# Patient Record
Sex: Female | Born: 1990 | Race: Black or African American | Hispanic: No | Marital: Single | Smoking: Never smoker
Health system: Southern US, Community
[De-identification: ages and names within clinical notes are randomized; demographics above are authoritative.]

## PROBLEM LIST (undated history)

## (undated) DIAGNOSIS — F32A Depression, unspecified: Secondary | ICD-10-CM

## (undated) DIAGNOSIS — M549 Dorsalgia, unspecified: Secondary | ICD-10-CM

## (undated) DIAGNOSIS — M542 Cervicalgia: Secondary | ICD-10-CM

## (undated) DIAGNOSIS — I10 Essential (primary) hypertension: Secondary | ICD-10-CM

## (undated) DIAGNOSIS — R12 Heartburn: Secondary | ICD-10-CM

## (undated) DIAGNOSIS — G43909 Migraine, unspecified, not intractable, without status migrainosus: Secondary | ICD-10-CM

## (undated) DIAGNOSIS — O039 Complete or unspecified spontaneous abortion without complication: Secondary | ICD-10-CM

## (undated) DIAGNOSIS — G4700 Insomnia, unspecified: Secondary | ICD-10-CM

## (undated) DIAGNOSIS — F419 Anxiety disorder, unspecified: Secondary | ICD-10-CM

## (undated) DIAGNOSIS — F329 Major depressive disorder, single episode, unspecified: Secondary | ICD-10-CM

## (undated) DIAGNOSIS — D649 Anemia, unspecified: Secondary | ICD-10-CM

## (undated) HISTORY — DX: Cervicalgia: M54.2

## (undated) HISTORY — DX: Major depressive disorder, single episode, unspecified: F32.9

## (undated) HISTORY — DX: Insomnia, unspecified: G47.00

## (undated) HISTORY — DX: Migraine, unspecified, not intractable, without status migrainosus: G43.909

## (undated) HISTORY — DX: Anxiety disorder, unspecified: F41.9

## (undated) HISTORY — DX: Anemia, unspecified: D64.9

## (undated) HISTORY — DX: Complete or unspecified spontaneous abortion without complication: O03.9

## (undated) HISTORY — DX: Heartburn: R12

## (undated) HISTORY — PX: OTHER SURGICAL HISTORY: SHX169

## (undated) HISTORY — DX: Essential (primary) hypertension: I10

## (undated) HISTORY — DX: Dorsalgia, unspecified: M54.9

## (undated) HISTORY — DX: Depression, unspecified: F32.A

---

## 2015-10-22 DIAGNOSIS — N96 Recurrent pregnancy loss: Secondary | ICD-10-CM | POA: Insufficient documentation

## 2015-11-09 ENCOUNTER — Encounter (HOSPITAL_COMMUNITY): Payer: Self-pay

## 2015-11-13 ENCOUNTER — Encounter (HOSPITAL_COMMUNITY): Payer: Self-pay

## 2015-11-16 ENCOUNTER — Encounter (HOSPITAL_COMMUNITY): Payer: Self-pay

## 2015-11-20 ENCOUNTER — Encounter (HOSPITAL_COMMUNITY): Payer: Self-pay

## 2015-11-20 ENCOUNTER — Encounter (HOSPITAL_COMMUNITY): Payer: Self-pay | Admitting: Internal Medicine

## 2015-11-27 ENCOUNTER — Encounter (HOSPITAL_COMMUNITY): Payer: Self-pay

## 2015-11-27 ENCOUNTER — Ambulatory Visit (HOSPITAL_COMMUNITY)
Admission: RE | Admit: 2015-11-27 | Discharge: 2015-11-27 | Disposition: A | Discharge status: Home / Self Care | Payer: Medicaid Other | Source: Ambulatory Visit | Attending: Internal Medicine | Admitting: Internal Medicine

## 2015-11-27 DIAGNOSIS — E559 Vitamin D deficiency, unspecified: Secondary | ICD-10-CM | POA: Insufficient documentation

## 2015-11-27 DIAGNOSIS — I10 Essential (primary) hypertension: Secondary | ICD-10-CM | POA: Diagnosis not present

## 2015-11-27 DIAGNOSIS — E7800 Pure hypercholesterolemia, unspecified: Secondary | ICD-10-CM | POA: Insufficient documentation

## 2015-11-27 DIAGNOSIS — Z3169 Encounter for other general counseling and advice on procreation: Secondary | ICD-10-CM | POA: Insufficient documentation

## 2015-11-27 DIAGNOSIS — E119 Type 2 diabetes mellitus without complications: Secondary | ICD-10-CM | POA: Insufficient documentation

## 2015-11-27 DIAGNOSIS — N96 Recurrent pregnancy loss: Secondary | ICD-10-CM
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
## 2015-11-27 NOTE — ED Notes (Signed)
Patient vital signs Wt. 197.6 BP 123/84, 74

## 2015-12-06 DIAGNOSIS — R200 Anesthesia of skin: Secondary | ICD-10-CM | POA: Insufficient documentation

## 2015-12-06 DIAGNOSIS — R202 Paresthesia of skin: Secondary | ICD-10-CM | POA: Insufficient documentation

## 2015-12-06 DIAGNOSIS — F0781 Postconcussional syndrome: Secondary | ICD-10-CM | POA: Insufficient documentation

## 2016-08-22 ENCOUNTER — Ambulatory Visit: Payer: Medicaid Other | Admitting: Sports Medicine

## 2016-09-17 ENCOUNTER — Ambulatory Visit (INDEPENDENT_AMBULATORY_CARE_PROVIDER_SITE_OTHER): Payer: Medicaid Other

## 2016-09-17 ENCOUNTER — Ambulatory Visit (INDEPENDENT_AMBULATORY_CARE_PROVIDER_SITE_OTHER): Payer: Medicaid Other | Admitting: Sports Medicine

## 2016-09-17 ENCOUNTER — Encounter: Payer: Self-pay | Admitting: Sports Medicine

## 2016-09-17 DIAGNOSIS — M792 Neuralgia and neuritis, unspecified: Secondary | ICD-10-CM

## 2016-09-17 DIAGNOSIS — M79672 Pain in left foot: Secondary | ICD-10-CM

## 2016-09-17 DIAGNOSIS — M722 Plantar fascial fibromatosis: Secondary | ICD-10-CM

## 2016-09-17 DIAGNOSIS — M7752 Other enthesopathy of left foot: Secondary | ICD-10-CM

## 2016-09-17 DIAGNOSIS — M7750 Other enthesopathy of unspecified foot: Secondary | ICD-10-CM | POA: Diagnosis not present

## 2016-09-17 DIAGNOSIS — G9050 Complex regional pain syndrome I, unspecified: Secondary | ICD-10-CM

## 2016-09-17 MED ORDER — TRIAMCINOLONE ACETONIDE 40 MG/ML IJ SUSP: 20.0000 mg | Freq: Once | INTRAMUSCULAR | Status: AC

## 2016-09-17 MED ORDER — GABAPENTIN 300 MG PO CAPS: 300.0000 mg | ORAL_CAPSULE | Freq: Every day | ORAL | 3 refills | Status: DC

## 2016-09-17 MED ORDER — GABAPENTIN 300 MG PO CAPS: 300.0000 mg | ORAL_CAPSULE | Freq: Every day | ORAL | 3 refills | Status: AC
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
## 2016-10-15 ENCOUNTER — Encounter: Payer: Self-pay | Admitting: Sports Medicine

## 2016-10-15 ENCOUNTER — Ambulatory Visit (INDEPENDENT_AMBULATORY_CARE_PROVIDER_SITE_OTHER): Payer: Medicaid Other | Admitting: Sports Medicine

## 2016-10-15 DIAGNOSIS — M7752 Other enthesopathy of left foot: Secondary | ICD-10-CM

## 2016-10-15 DIAGNOSIS — M79672 Pain in left foot: Secondary | ICD-10-CM

## 2016-10-15 DIAGNOSIS — G9050 Complex regional pain syndrome I, unspecified: Secondary | ICD-10-CM

## 2016-10-15 DIAGNOSIS — M792 Neuralgia and neuritis, unspecified: Secondary | ICD-10-CM
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
## 2016-10-16 ENCOUNTER — Telehealth: Payer: Self-pay | Admitting: *Deleted

## 2016-10-16 DIAGNOSIS — M779 Enthesopathy, unspecified: Secondary | ICD-10-CM

## 2016-10-16 DIAGNOSIS — G9050 Complex regional pain syndrome I, unspecified: Secondary | ICD-10-CM

## 2016-10-16 DIAGNOSIS — M79672 Pain in left foot: Secondary | ICD-10-CM

## 2016-10-16 DIAGNOSIS — M792 Neuralgia and neuritis, unspecified: Secondary | ICD-10-CM
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
## 2016-10-29 LAB — C-REACTIVE PROTEIN: CRP: 3.8 mg/L (ref 0.0–4.9)

## 2016-10-29 LAB — CBC WITH DIFFERENTIAL/PLATELET
Basophils Absolute: 0 10*3/uL (ref 0.0–0.2)
Basos: 1 %
EOS (ABSOLUTE): 0.2 10*3/uL (ref 0.0–0.4)
Eos: 5 %
HEMOGLOBIN: 11.7 g/dL (ref 11.1–15.9)
Hematocrit: 35.4 % (ref 34.0–46.6)
Immature Grans (Abs): 0 10*3/uL (ref 0.0–0.1)
Immature Granulocytes: 0 %
LYMPHS ABS: 1.4 10*3/uL (ref 0.7–3.1)
Lymphs: 31 %
MCH: 26.2 pg — AB (ref 26.6–33.0)
MCHC: 33.1 g/dL (ref 31.5–35.7)
MCV: 79 fL (ref 79–97)
MONOS ABS: 0.2 10*3/uL (ref 0.1–0.9)
Monocytes: 4 %
NEUTROS ABS: 2.7 10*3/uL (ref 1.4–7.0)
Neutrophils: 59 %
Platelets: 319 10*3/uL (ref 150–379)
RBC: 4.46 x10E6/uL (ref 3.77–5.28)
RDW: 15.4 % (ref 12.3–15.4)
WBC: 4.4 10*3/uL (ref 3.4–10.8)

## 2016-10-29 LAB — BASIC METABOLIC PANEL
BUN / CREAT RATIO: 16 (ref 9–23)
BUN: 12 mg/dL (ref 6–20)
CO2: 22 mmol/L (ref 18–29)
CREATININE: 0.76 mg/dL (ref 0.57–1.00)
Calcium: 9.3 mg/dL (ref 8.7–10.2)
Chloride: 103 mmol/L (ref 96–106)
GFR calc Af Amer: 124 (ref >59)
GFR, EST NON AFRICAN AMERICAN: 108 (ref >59)
GLUCOSE: 92 mg/dL (ref 65–99)
POTASSIUM: 4.2 mmol/L (ref 3.5–5.2)
SODIUM: 141 mmol/L (ref 134–144)

## 2016-10-29 LAB — RHEUMATOID FACTOR

## 2016-10-29 LAB — SEDIMENTATION RATE: Sed Rate: 21 mm/hr (ref 0–32)

## 2016-10-29 LAB — HLA-B27 ANTIGEN: HLA B27: NEGATIVE

## 2016-10-29 LAB — ANA: Anti Nuclear Antibody(ANA): NEGATIVE

## 2016-10-31 NOTE — Telephone Encounter (Addendum)
-----   Message from [place] Stover, [place] sent at 10/29/2016  5:24 PM EST ----- Can you let patient know that her blood work is negative and to proceed with nerve studies and that I will see her back after she has gotten EMG/NCV tests done -Dr Kathie RhodesS. 10/31/2016-I informed pt so Dr. Wynema BirchStover's orders and gave [facility]'s [phone] to schedule her testing.

## 2017-03-16 ENCOUNTER — Encounter: Payer: Self-pay | Admitting: Neurology

## 2017-03-19 ENCOUNTER — Telehealth: Payer: Self-pay | Admitting: *Deleted

## 2017-03-19 DIAGNOSIS — M792 Neuralgia and neuritis, unspecified: Secondary | ICD-10-CM

## 2017-03-19 DIAGNOSIS — G9050 Complex regional pain syndrome I, unspecified: Secondary | ICD-10-CM

## 2017-03-19 NOTE — Telephone Encounter (Addendum)
Pt [place] [address] [place] closed and she needs to be referred to another group, [facility] at [phone]. [facility] [place] Attn:  [place].

## 2017-03-20 NOTE — Telephone Encounter (Signed)
Ok thank you 

## 2017-06-22 ENCOUNTER — Ambulatory Visit (INDEPENDENT_AMBULATORY_CARE_PROVIDER_SITE_OTHER): Payer: Self-pay | Admitting: Neurology

## 2017-06-22 ENCOUNTER — Encounter: Payer: Self-pay | Admitting: Neurology

## 2017-06-22 VITALS — BP 100/68 | HR 80 | Ht 71.0 in | Wt 204.5 lb

## 2017-06-22 DIAGNOSIS — R202 Paresthesia of skin: Secondary | ICD-10-CM

## 2017-06-22 DIAGNOSIS — M2550 Pain in unspecified joint: Secondary | ICD-10-CM

## 2017-06-22 DIAGNOSIS — M7918 Myalgia, other site: Secondary | ICD-10-CM
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
## 2017-06-22 NOTE — Patient Instructions (Addendum)
NCS/EMG of the right arm and leg  We will call you with the results of your testing

## 2017-07-07 ENCOUNTER — Encounter: Payer: Self-pay | Admitting: Neurology

## 2019-10-28 ENCOUNTER — Encounter: Payer: Self-pay | Admitting: Sports Medicine

## 2019-10-28 ENCOUNTER — Ambulatory Visit (INDEPENDENT_AMBULATORY_CARE_PROVIDER_SITE_OTHER): Payer: Medicaid Other

## 2019-10-28 ENCOUNTER — Other Ambulatory Visit: Payer: Self-pay | Admitting: Sports Medicine

## 2019-10-28 ENCOUNTER — Other Ambulatory Visit: Payer: Self-pay

## 2019-10-28 ENCOUNTER — Ambulatory Visit: Payer: Medicaid Other | Admitting: Sports Medicine

## 2019-10-28 DIAGNOSIS — M7752 Other enthesopathy of left foot: Secondary | ICD-10-CM

## 2019-10-28 DIAGNOSIS — G8929 Other chronic pain: Secondary | ICD-10-CM

## 2019-10-28 DIAGNOSIS — M25572 Pain in left ankle and joints of left foot: Secondary | ICD-10-CM | POA: Diagnosis not present

## 2019-10-28 DIAGNOSIS — M2140 Flat foot [pes planus] (acquired), unspecified foot: Secondary | ICD-10-CM

## 2019-10-28 DIAGNOSIS — M79672 Pain in left foot: Secondary | ICD-10-CM

## 2019-10-28 DIAGNOSIS — M76822 Posterior tibial tendinitis, left leg: Secondary | ICD-10-CM | POA: Diagnosis not present
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
## 2019-11-07 ENCOUNTER — Other Ambulatory Visit: Payer: Self-pay | Admitting: Sports Medicine

## 2019-11-07 DIAGNOSIS — M7752 Other enthesopathy of left foot: Secondary | ICD-10-CM

## 2020-02-08 ENCOUNTER — Ambulatory Visit: Payer: Medicaid Other | Admitting: Sports Medicine

## 2020-02-08 ENCOUNTER — Other Ambulatory Visit: Payer: Self-pay | Admitting: Sports Medicine

## 2020-02-08 DIAGNOSIS — M25572 Pain in left ankle and joints of left foot: Secondary | ICD-10-CM

## 2020-02-09 ENCOUNTER — Ambulatory Visit (INDEPENDENT_AMBULATORY_CARE_PROVIDER_SITE_OTHER): Payer: Medicaid Other | Admitting: Sports Medicine

## 2020-02-09 ENCOUNTER — Encounter: Payer: Self-pay | Admitting: Sports Medicine

## 2020-02-09 ENCOUNTER — Other Ambulatory Visit: Payer: Self-pay

## 2020-02-09 DIAGNOSIS — M792 Neuralgia and neuritis, unspecified: Secondary | ICD-10-CM

## 2020-02-09 DIAGNOSIS — M779 Enthesopathy, unspecified: Secondary | ICD-10-CM

## 2020-02-09 DIAGNOSIS — M25572 Pain in left ankle and joints of left foot: Secondary | ICD-10-CM

## 2020-02-09 DIAGNOSIS — M25372 Other instability, left ankle: Secondary | ICD-10-CM

## 2020-02-09 DIAGNOSIS — M7752 Other enthesopathy of left foot: Secondary | ICD-10-CM | POA: Diagnosis not present

## 2020-02-09 MED ORDER — PREDNISONE 10 MG (21) PO TBPK: ORAL_TABLET | ORAL | 0 refills | Status: AC

## 2020-02-09 NOTE — Progress Notes (Signed)
Subjective: Kristina Yates is a 29 y.o. female patient who presents to office for evaluation of left ankle pain. Patient complains of continued pain in the ankle after twisting it when walking on Friday. Patient has tried ibuprofen with no relief in symptoms.  Reports that she went to the hospital and they took x-rays nothing was fracture and they put her in an Ace wrap with a ankle stabilizer but still hurts specially even at night with the way the covers on her foot and ankle.  Reports random sharp shooting pain across the entire foot and ankle pain 7 out of 10.  Patient reports that she has been walking on toes due to pain in her foot and ankle and walking on her toes makes the pain more tolerable.  No other issues noted.  Patient Active Problem List   Diagnosis Date Noted  . Numbness and tingling 12/06/2015  . Post concussion syndrome 12/06/2015  . History of recurrent miscarriages, not currently pregnant 10/22/2015    Current Outpatient Medications on File Prior to Visit  Medication Sig Dispense Refill  . cyclobenzaprine (FEXMID) 7.5 MG tablet Take 7.5 mg by mouth 3 (three) times daily as needed for muscle spasms.    Marland Kitchen gabapentin (NEURONTIN) 300 MG capsule Take 1 capsule (300 mg total) by mouth at bedtime. 90 capsule 3   Current Facility-Administered Medications on File Prior to Visit  Medication Dose Route Frequency Provider Last Rate Last Admin  . triamcinolone acetonide (KENALOG-40) injection 20 mg  20 mg Other Once Asencion Islam, DPM        Allergies  Allergen Reactions  . Tylenol [Acetaminophen] Hives and Rash    Objective:  General: Alert and oriented x3 in no acute distress  Dermatology: No open lesions bilateral lower extremities, no webspace macerations, no ecchymosis bilateral, all nails x 10 are well manicured.  Vascular: Dorsalis Pedis and Posterior Tibial pedal pulses palpable, Capillary Fill Time 3 seconds,(+) pedal hair growth bilateral, no edema bilateral lower  extremities, Temperature gradient within normal limits.  Neurology: Gross sensation intact via light touch bilateral, subjective sharp shooting pain to the left foot and ankle.   Musculoskeletal: Mild tenderness with palpation at lateral greater than medial ankle worse with plantarflexion and inversion of the left foot and ankle.  Mild ankle instability noted to the left. Strength within normal limits in all groups bilateral.   Gait: Antalgic gait   Assessment and Plan: Problem List Items Addressed This Visit    None    Visit Diagnoses    Left ankle pain, unspecified chronicity    -  Primary   Capsulitis of ankle, left       Relevant Medications   predniSONE (STERAPRED UNI-PAK 21 TAB) 10 MG (21) TBPK tablet   Neuritis       Ankle instability, left       Tendonitis          -Complete examination performed -Previous x-rays negative -Discussed treatement options for history of chronic ankle pain with recurrent injury/sprain -Rx prednisone -Applied Unna boot for patient to keep intact for at least 5 days and dispensed a use postoperative shoe for patient to wear during this timeframe advised patient that when she removes the [employer] boot if it feels better to continue with good supportive shoes and ankle support however if pain is unrelieved to call office after 1 week for [place] to proceed with ordering an MRI -Patient to return to office as needed or sooner if condition worsens.  Lyndon Chapel  Cannon Kettle, DPM

## 2020-02-17 ENCOUNTER — Telehealth: Payer: Self-pay | Admitting: Urology

## 2020-02-17 DIAGNOSIS — M7752 Other enthesopathy of left foot: Secondary | ICD-10-CM

## 2020-02-17 DIAGNOSIS — M792 Neuralgia and neuritis, unspecified: Secondary | ICD-10-CM

## 2020-02-17 DIAGNOSIS — M25572 Pain in left ankle and joints of left foot: Secondary | ICD-10-CM

## 2020-02-17 DIAGNOSIS — M25372 Other instability, left ankle: Secondary | ICD-10-CM

## 2020-02-17 DIAGNOSIS — M779 Enthesopathy, unspecified: Secondary | ICD-10-CM

## 2020-02-17 NOTE — Telephone Encounter (Signed)
Order MRI ankle please refer to my last clinical note Thanks Dr. Kathie Rhodes

## 2020-02-17 NOTE — Telephone Encounter (Signed)
Pt took soft cast off after wearing it for a week and is still not able to put full weight on ankle would like to have an MRI done. Please Advise. Thanks

## 2020-02-20 NOTE — Telephone Encounter (Signed)
[facility] - Medicaid requires clinicals for review to pre-cert [id number] left ankle without contrast Case:  [id number]. Faxed clinicals to [facility].

## 2020-02-24 NOTE — Telephone Encounter (Signed)
Changed to [facility] with Derrek Gu.

## 2020-02-24 NOTE — Telephone Encounter (Signed)
[facility] - Candace scheduled pt for MRI [id number] left ankle without contrast on 02/28/2020 arrive 4:15pm for 4:30pm testing. Faxed orders with approval to [facility].

## 2020-02-24 NOTE — Telephone Encounter (Signed)
[facility] - Melissa states the MRI is denied and ordered for the hospital. I gave Melissa the PA from Dr. Wynema Birch PEER to PEER [id number].

## 2020-03-06 ENCOUNTER — Encounter: Payer: Self-pay | Admitting: Sports Medicine

## 2020-03-08 DIAGNOSIS — Z419 Encounter for procedure for purposes other than remedying health state, unspecified: Secondary | ICD-10-CM | POA: Diagnosis not present

## 2020-04-08 DIAGNOSIS — Z419 Encounter for procedure for purposes other than remedying health state, unspecified: Secondary | ICD-10-CM | POA: Diagnosis not present

## 2020-04-27 DIAGNOSIS — R109 Unspecified abdominal pain: Secondary | ICD-10-CM | POA: Diagnosis not present

## 2020-04-27 DIAGNOSIS — R110 Nausea: Secondary | ICD-10-CM | POA: Diagnosis not present

## 2020-04-27 DIAGNOSIS — N946 Dysmenorrhea, unspecified: Secondary | ICD-10-CM | POA: Diagnosis not present

## 2020-04-27 DIAGNOSIS — R1030 Lower abdominal pain, unspecified: Secondary | ICD-10-CM | POA: Diagnosis not present

## 2020-04-27 DIAGNOSIS — R197 Diarrhea, unspecified: Secondary | ICD-10-CM | POA: Diagnosis not present

## 2020-05-09 DIAGNOSIS — Z419 Encounter for procedure for purposes other than remedying health state, unspecified: Secondary | ICD-10-CM | POA: Diagnosis not present

## 2020-05-21 DIAGNOSIS — N898 Other specified noninflammatory disorders of vagina: Secondary | ICD-10-CM | POA: Diagnosis not present

## 2020-05-28 DIAGNOSIS — R1031 Right lower quadrant pain: Secondary | ICD-10-CM | POA: Diagnosis not present

## 2020-05-29 DIAGNOSIS — R109 Unspecified abdominal pain: Secondary | ICD-10-CM | POA: Diagnosis not present

## 2020-05-29 DIAGNOSIS — R10819 Abdominal tenderness, unspecified site: Secondary | ICD-10-CM | POA: Diagnosis not present

## 2020-06-06 DIAGNOSIS — Z113 Encounter for screening for infections with a predominantly sexual mode of transmission: Secondary | ICD-10-CM | POA: Diagnosis not present

## 2020-06-06 DIAGNOSIS — Z1329 Encounter for screening for other suspected endocrine disorder: Secondary | ICD-10-CM | POA: Diagnosis not present

## 2020-06-06 DIAGNOSIS — N979 Female infertility, unspecified: Secondary | ICD-10-CM | POA: Diagnosis not present

## 2020-06-06 DIAGNOSIS — Z1159 Encounter for screening for other viral diseases: Secondary | ICD-10-CM | POA: Diagnosis not present

## 2020-06-06 DIAGNOSIS — N96 Recurrent pregnancy loss: Secondary | ICD-10-CM | POA: Diagnosis not present

## 2020-06-06 DIAGNOSIS — Z0000 Encounter for general adult medical examination without abnormal findings: Secondary | ICD-10-CM | POA: Diagnosis not present

## 2020-06-06 DIAGNOSIS — Z01411 Encounter for gynecological examination (general) (routine) with abnormal findings: Secondary | ICD-10-CM | POA: Diagnosis not present

## 2020-06-06 DIAGNOSIS — Z114 Encounter for screening for human immunodeficiency virus [HIV]: Secondary | ICD-10-CM | POA: Diagnosis not present

## 2020-06-06 DIAGNOSIS — R7989 Other specified abnormal findings of blood chemistry: Secondary | ICD-10-CM | POA: Diagnosis not present

## 2020-06-08 DIAGNOSIS — Z419 Encounter for procedure for purposes other than remedying health state, unspecified: Secondary | ICD-10-CM | POA: Diagnosis not present

## 2020-07-05 DIAGNOSIS — N979 Female infertility, unspecified: Secondary | ICD-10-CM | POA: Diagnosis not present

## 2020-07-05 DIAGNOSIS — N96 Recurrent pregnancy loss: Secondary | ICD-10-CM | POA: Diagnosis not present

## 2020-07-09 DIAGNOSIS — Z419 Encounter for procedure for purposes other than remedying health state, unspecified: Secondary | ICD-10-CM | POA: Diagnosis not present

## 2020-07-18 DIAGNOSIS — M25561 Pain in right knee: Secondary | ICD-10-CM | POA: Diagnosis not present

## 2020-07-18 DIAGNOSIS — M5459 Other low back pain: Secondary | ICD-10-CM | POA: Diagnosis not present

## 2020-08-05 DIAGNOSIS — R109 Unspecified abdominal pain: Secondary | ICD-10-CM | POA: Diagnosis not present

## 2020-08-08 DIAGNOSIS — Z419 Encounter for procedure for purposes other than remedying health state, unspecified: Secondary | ICD-10-CM | POA: Diagnosis not present

## 2020-09-08 DIAGNOSIS — Z419 Encounter for procedure for purposes other than remedying health state, unspecified: Secondary | ICD-10-CM | POA: Diagnosis not present

## 2020-09-27 DIAGNOSIS — J069 Acute upper respiratory infection, unspecified: Secondary | ICD-10-CM | POA: Diagnosis not present

## 2020-09-27 DIAGNOSIS — Z20822 Contact with and (suspected) exposure to covid-19: Secondary | ICD-10-CM | POA: Diagnosis not present

## 2020-09-27 DIAGNOSIS — U071 COVID-19: Secondary | ICD-10-CM | POA: Diagnosis not present

## 2020-10-09 DIAGNOSIS — Z419 Encounter for procedure for purposes other than remedying health state, unspecified: Secondary | ICD-10-CM | POA: Diagnosis not present

## 2020-10-17 DIAGNOSIS — M62830 Muscle spasm of back: Secondary | ICD-10-CM | POA: Diagnosis not present

## 2020-11-06 DIAGNOSIS — Z419 Encounter for procedure for purposes other than remedying health state, unspecified: Secondary | ICD-10-CM | POA: Diagnosis not present

## 2020-12-07 DIAGNOSIS — Z419 Encounter for procedure for purposes other than remedying health state, unspecified: Secondary | ICD-10-CM | POA: Diagnosis not present

## 2020-12-25 ENCOUNTER — Encounter (HOSPITAL_COMMUNITY): Payer: Self-pay | Admitting: Emergency Medicine

## 2020-12-25 ENCOUNTER — Emergency Department (HOSPITAL_COMMUNITY)
Admission: EM | Admit: 2020-12-25 | Discharge: 2020-12-25 | Discharge status: Against Medical Advice | Payer: Medicaid Other | Attending: Emergency Medicine | Admitting: Emergency Medicine

## 2020-12-25 DIAGNOSIS — S161XXA Strain of muscle, fascia and tendon at neck level, initial encounter: Secondary | ICD-10-CM | POA: Diagnosis not present

## 2020-12-25 DIAGNOSIS — N96 Recurrent pregnancy loss: Secondary | ICD-10-CM | POA: Diagnosis not present

## 2020-12-25 DIAGNOSIS — M542 Cervicalgia: Secondary | ICD-10-CM | POA: Diagnosis not present

## 2020-12-25 DIAGNOSIS — F5089 Other specified eating disorder: Secondary | ICD-10-CM | POA: Diagnosis not present

## 2020-12-25 DIAGNOSIS — N979 Female infertility, unspecified: Secondary | ICD-10-CM | POA: Diagnosis not present

## 2020-12-25 DIAGNOSIS — I10 Essential (primary) hypertension: Secondary | ICD-10-CM | POA: Insufficient documentation

## 2020-12-25 DIAGNOSIS — D649 Anemia, unspecified: Secondary | ICD-10-CM | POA: Diagnosis not present

## 2020-12-25 DIAGNOSIS — X500XXA Overexertion from strenuous movement or load, initial encounter: Secondary | ICD-10-CM | POA: Diagnosis not present

## 2020-12-25 MED ORDER — PREDNISONE 20 MG PO TABS
60.0000 mg | ORAL_TABLET | Freq: Once | ORAL | Status: DC
  Filled 2020-12-25: qty 3

## 2020-12-25 NOTE — ED Notes (Signed)
This RN went to give meds, pt not currently in room.

## 2020-12-25 NOTE — ED Provider Notes (Signed)
[facility] EMERGENCY DEPARTMENT Provider Note   CSN: [id number] Arrival date & time: 12/25/20  8546     History No chief complaint on file.   Kristina Yates is a 30 y.o. female.  HPI She presents for evaluation of right-sided neck pain, radiating to her right arm, onset 2 days ago spontaneously.  Yesterday she went to work and had to alter her activities because of pain.  She completed physical therapy about 2 weeks ago, as treatment for a neck injury that occurred several weeks ago.  At the completion of physical therapy she was referred back to the clinic that had referred her for PT.  She tried to see the clinic, an urgent care, but they referred her back to PT.  She does not have any management by a specialist.  She states that she is trying to receive care, through the [employer] system.  She did not go to work today because of the pain which she is having.  She states that she has previously been treated with prednisone for pain similar to this.  As an after thought she told me that she was having "headaches."      Past Medical History:  Diagnosis Date  . Anemia   . Anxiety   . Back pain   . Depression   . Heartburn   . Hypertension   . Insomnia   . Migraine   . Miscarriage   . Neck pain     Patient Active Problem List   Diagnosis Date Noted  . Numbness and tingling 12/06/2015  . Post concussion syndrome 12/06/2015  . History of recurrent miscarriages, not currently pregnant 10/22/2015    Past Surgical History:  Procedure Laterality Date  . ovarian cyst resection       OB History    Gravida  8   Para      Term      Preterm      AB  7   Living  1     SAB  7   IAB      Ectopic      Multiple      Live Births  1           Family History  Problem Relation Age of Onset  . Asthma Mother   . Hypertension Mother   . Hypothyroidism Sister   . Hypertension Maternal Grandmother     Social History   Tobacco  Use  . Smoking status: Never Smoker  . Smokeless tobacco: Never Used  Vaping Use  . Vaping Use: Never used  Substance Use Topics  . Alcohol use: No  . Drug use: No    Home Medications Prior to Admission medications   Medication Sig Start Date End Date Taking? Authorizing Provider  cyclobenzaprine (FEXMID) 7.5 MG tablet Take 7.5 mg by mouth 3 (three) times daily as needed for muscle spasms.    [provider]  gabapentin (NEURONTIN) 300 MG capsule Take 1 capsule (300 mg total) by mouth at bedtime. 09/17/16   Asencion Islam, DPM  predniSONE (STERAPRED UNI-PAK 21 TAB) 10 MG (21) TBPK tablet Take as directed 02/09/20   Asencion Islam, DPM    Allergies    Tylenol [acetaminophen]  Review of Systems   Review of Systems  All other systems reviewed and are negative.   Physical Exam Updated Vital Signs BP 118/74   Pulse 72   Temp 97.8 F (36.6 C) (Oral)   Resp 19  SpO2 100%   Physical Exam Vitals and nursing note reviewed.  Constitutional:      General: She is not in acute distress.    Appearance: She is well-developed. She is not ill-appearing or diaphoretic.  HENT:     Head: Normocephalic and atraumatic.     Right Ear: External ear normal.     Left Ear: External ear normal.  Eyes:     Conjunctiva/sclera: Conjunctivae normal.     Pupils: Pupils are equal, round, and reactive to light.  Neck:     Trachea: Phonation normal.  Cardiovascular:     Rate and Rhythm: Normal rate.  Pulmonary:     Effort: Pulmonary effort is normal.  Abdominal:     General: There is no distension.  Musculoskeletal:        General: Tenderness (Right paraspinal cervical and right trapezius; mild) present. No swelling. Normal range of motion.     Cervical back: Normal range of motion and neck supple.  Skin:    General: Skin is warm and dry.  Neurological:     Mental Status: She is alert and oriented to person, place, and time.     Cranial Nerves: No cranial nerve deficit.      Sensory: No sensory deficit.     Motor: No abnormal muscle tone.     Coordination: Coordination normal.  Psychiatric:        Mood and Affect: Mood normal.        Behavior: Behavior normal.        Thought Content: Thought content normal.        Judgment: Judgment normal.     ED Results / Procedures / Treatments   Labs (all labs ordered are listed, but only abnormal results are displayed) Labs Reviewed - No data to display  EKG None  Radiology No results found.  Procedures Procedures   Medications Ordered in ED Medications  predniSONE (DELTASONE) tablet 60 mg (has no administration in time range)    ED Course  I have reviewed the triage vital signs and the nursing notes.  Pertinent labs & imaging results that were available during my care of the patient were reviewed by me and considered in my medical decision making (see chart for details).  Clinical Course as of 12/25/20 1021  Tue Dec 25, 2020  1020 She is not currently in the room. [EW]  1020 Prednisone ordered at 830 was apparently not given. [EW]    Clinical Course User Index [EW] Mancel Bale, MD   MDM Rules/Calculators/A&P                           Patient Vitals for the past 24 hrs:  BP Temp Temp src Pulse Resp SpO2  12/25/20 0900 118/74 -- -- 72 19 100 %  12/25/20 0812 134/68 97.8 F (36.6 C) Oral 82 18 100 %    10:21 AM Reevaluation with update and discussion. After initial assessment and treatment, an updated evaluation reveals she has a low. Mancel Bale   Medical Decision Making:  This patient is presenting for evaluation of neck pain ongoing/recurrent, which does require a range of treatment options, and is a complaint that involves a moderate risk of morbidity and mortality. The differential diagnoses include muscle strain, radiculopathy, progression of chronic neck pain. I decided to review old records, and in summary Young female presenting with neck pain that started spontaneously 2 days  ago, similar to prior pain which required  physical therapy.  I do not recall additional historical information from anyone.    Critical Interventions-clinical evaluation, monitoring  After These Interventions, the Patient was reevaluated and was found to have left prior to reevaluation.  CRITICAL CARE-no Performed by: Mancel Bale   Nursing Notes Reviewed/ Care Coordinated Applicable Imaging Reviewed Interpretation of Laboratory Data incorporated into ED treatment  The patient left AGAINST MEDICAL ADVICE.   Final Clinical Impression(s) / ED Diagnoses Final diagnoses:  Neck pain    Rx / DC Orders ED Discharge Orders    None       Mancel Bale, MD 12/25/20 2031

## 2020-12-25 NOTE — ED Notes (Signed)
Pt never returned to room & was discharged AMA.

## 2020-12-25 NOTE — ED Triage Notes (Signed)
Pt here with c/o neck pain mostly on the right side , pt hurt her neck  back in feb at work was seen at [employer] , pt just finished up PT a few weeks ago,

## 2021-01-06 DIAGNOSIS — Z419 Encounter for procedure for purposes other than remedying health state, unspecified: Secondary | ICD-10-CM | POA: Diagnosis not present

## 2021-02-06 DIAGNOSIS — Z419 Encounter for procedure for purposes other than remedying health state, unspecified: Secondary | ICD-10-CM | POA: Diagnosis not present

## 2021-03-08 DIAGNOSIS — Z419 Encounter for procedure for purposes other than remedying health state, unspecified: Secondary | ICD-10-CM | POA: Diagnosis not present

## 2021-03-11 DIAGNOSIS — R300 Dysuria: Secondary | ICD-10-CM | POA: Diagnosis not present

## 2021-03-11 DIAGNOSIS — N898 Other specified noninflammatory disorders of vagina: Secondary | ICD-10-CM | POA: Diagnosis not present

## 2021-03-15 ENCOUNTER — Emergency Department (HOSPITAL_BASED_OUTPATIENT_CLINIC_OR_DEPARTMENT_OTHER)
Admission: EM | Admit: 2021-03-15 | Discharge: 2021-03-15 | Disposition: A | Discharge status: Home / Self Care | Payer: No Typology Code available for payment source | Attending: Emergency Medicine | Admitting: Emergency Medicine

## 2021-03-15 ENCOUNTER — Encounter (HOSPITAL_BASED_OUTPATIENT_CLINIC_OR_DEPARTMENT_OTHER): Payer: Self-pay | Admitting: *Deleted

## 2021-03-15 ENCOUNTER — Emergency Department (HOSPITAL_BASED_OUTPATIENT_CLINIC_OR_DEPARTMENT_OTHER): Payer: No Typology Code available for payment source

## 2021-03-15 ENCOUNTER — Other Ambulatory Visit: Payer: Self-pay

## 2021-03-15 DIAGNOSIS — W208XXA Other cause of strike by thrown, projected or falling object, initial encounter: Secondary | ICD-10-CM | POA: Diagnosis not present

## 2021-03-15 DIAGNOSIS — Y990 Civilian activity done for income or pay: Secondary | ICD-10-CM | POA: Insufficient documentation

## 2021-03-15 DIAGNOSIS — I10 Essential (primary) hypertension: Secondary | ICD-10-CM | POA: Insufficient documentation

## 2021-03-15 DIAGNOSIS — S335XXA Sprain of ligaments of lumbar spine, initial encounter: Secondary | ICD-10-CM

## 2021-03-15 DIAGNOSIS — M546 Pain in thoracic spine: Secondary | ICD-10-CM | POA: Insufficient documentation

## 2021-03-15 DIAGNOSIS — S3992XA Unspecified injury of lower back, initial encounter: Secondary | ICD-10-CM | POA: Diagnosis present

## 2021-03-15 LAB — PREGNANCY, URINE: Preg Test, Ur: NEGATIVE

## 2021-03-15 MED ORDER — NAPROXEN 500 MG PO TABS: 500.0000 mg | ORAL_TABLET | Freq: Two times a day (BID) | ORAL | 0 refills | Status: AC

## 2021-03-15 MED ORDER — METHOCARBAMOL 500 MG PO TABS: 500.0000 mg | ORAL_TABLET | Freq: Two times a day (BID) | ORAL | 0 refills | Status: AC

## 2021-03-15 MED ORDER — KETOROLAC TROMETHAMINE 30 MG/ML IJ SOLN
30.0000 mg | Freq: Once | INTRAMUSCULAR | Status: AC
  Administered 2021-03-15: 30 mg via INTRAMUSCULAR
  Filled 2021-03-15: qty 1

## 2021-03-15 MED ORDER — METHOCARBAMOL 500 MG PO TABS
500.0000 mg | ORAL_TABLET | Freq: Once | ORAL | Status: AC
  Administered 2021-03-15: 500 mg via ORAL
  Filled 2021-03-15: qty 1

## 2021-03-15 NOTE — ED Provider Notes (Signed)
[facility] EMERGENCY DEPARTMENT Provider Note   CSN: [id number] Arrival date & time: 03/15/21  2043     History Chief Complaint  Patient presents with   Back Injury    Kristina Yates is a 30 y.o. female with past medical history of anxiety, hypertension that presents to the emerged from today for back injury.  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  Patient states that she went home and ice the area, used Flexeril and NSAIDs that day without relief.  Patient states that she went back to work the next day and is still having the pain. Pain radiates into her right side of her thoracic region..Pain worse when she twists to the right.   Has not used any medication since the first day.  Normal gait.   Denies any numbness or tingling.  Denies any fevers, chills, nausea, vomiting, dysuria, chest pain or shortness of breath.  Denies any abdominal pain.  Patient states that she was in her normal health prior to this.  Patient also states that she does not do any IV drug use, no saddle paresthesias, no bowel or bladder incontinence, previous back surgery..  Patient states that she has been able to work, however is upset since the pain is still there.    HPI     Past Medical History:  Diagnosis Date   Anemia    Anxiety    Back pain    Depression    Heartburn    Hypertension    Insomnia    Migraine    Miscarriage    Neck pain     Patient Active Problem List   Diagnosis Date Noted   Numbness and tingling 12/06/2015   Post concussion syndrome 12/06/2015   History of recurrent miscarriages, not currently pregnant 10/22/2015    Past Surgical History:  Procedure Laterality Date   ovarian cyst resection       OB History     Gravida  8   Para      Term      Preterm      AB  7   Living   1      SAB  7   IAB      Ectopic      Multiple      Live Births  1           Family History  Problem Relation Age of Onset   Asthma Mother    Hypertension Mother    Hypothyroidism Sister    Hypertension Maternal Grandmother     Social History   Tobacco Use   Smoking status: Never   Smokeless tobacco: Never  Vaping Use   Vaping Use: Never used  Substance Use Topics   Alcohol use: No   Drug use: No    Home Medications Prior to Admission medications   Medication Sig Start Date End Date Taking? Authorizing Provider  cyclobenzaprine (FEXMID) 7.5 MG tablet Take 7.5 mg by mouth 3 (three) times daily as needed for muscle spasms.   Yes [provider]  gabapentin (NEURONTIN) 300 MG capsule Take 1 capsule (300 mg total) by mouth at bedtime. 09/17/16  Yes Stover, Titorya, DPM  methocarbamol (ROBAXIN) 500 MG tablet Take 1 tablet (500 mg total) by mouth 2 (  two) times daily. 03/15/21  Yes Kaspar Albornoz, PA-C  naproxen (NAPROSYN) 500 MG tablet Take 1 tablet (500 mg total) by mouth 2 (two) times daily. 03/15/21  Yes Farrel Gordon, PA-C  predniSONE (STERAPRED UNI-PAK 21 TAB) 10 MG (21) TBPK tablet Take as directed 02/09/20   Asencion Islam, DPM    Allergies    Tylenol [acetaminophen]  Review of Systems   Review of Systems  Constitutional:  Negative for diaphoresis, fatigue and fever.  Eyes:  Negative for visual disturbance.  Respiratory:  Negative for shortness of breath.   Cardiovascular:  Negative for chest pain.  Gastrointestinal:  Negative for nausea and vomiting.  Musculoskeletal:  Positive for back pain. Negative for myalgias.  Skin:  Negative for color change, pallor, rash and wound.  Neurological:  Negative for syncope, weakness, light-headedness, numbness and headaches.  Psychiatric/Behavioral:  Negative for behavioral problems and confusion.    Physical Exam Updated Vital Signs BP 126/75 (BP Location: Right Arm)   Pulse 70   Temp 98.9 F (37.2 C)  (Oral)   Resp 18   Ht 5\' 11"  (1.803 m)   Wt 87.5 kg   LMP 03/12/2021   SpO2 100%   BMI 26.92 kg/m   Physical Exam Constitutional:      General: She is not in acute distress.    Appearance: Normal appearance. She is not ill-appearing, toxic-appearing or diaphoretic.  Cardiovascular:     Rate and Rhythm: Normal rate and regular rhythm.     Pulses: Normal pulses.  Pulmonary:     Effort: Pulmonary effort is normal.     Breath sounds: Normal breath sounds.  Musculoskeletal:        General: Normal range of motion.       Back:     Comments: Patient with tenderness in the mid back, between thoracic and lumbar regions.  TTP to right side of this as well, see diagram.  Do not appreciate any muscle spasms, no objective numbness.  Able to range back normally.  Pain does cross midline, however more on the right side.  No area of fluctuance or erythema or warmth.  Skin:    General: Skin is warm and dry.     Capillary Refill: Capillary refill takes less than 2 seconds.  Neurological:     General: No focal deficit present.     Mental Status: She is alert and oriented to person, place, and time.  Psychiatric:        Mood and Affect: Mood normal.        Behavior: Behavior normal.        Thought Content: Thought content normal.    ED Results / Procedures / Treatments   Labs (all labs ordered are listed, but only abnormal results are displayed) Labs Reviewed  PREGNANCY, URINE    EKG None  Radiology DG Thoracic Spine 2 View  Result Date: 03/15/2021 CLINICAL DATA:  Back pain following heavy lifting, initial encounter EXAM: THORACIC SPINE 2 VIEWS COMPARISON:  None. FINDINGS: There is no evidence of thoracic spine fracture. Alignment is normal. No other significant bone abnormalities are identified. IMPRESSION: No acute abnormality noted. Electronically Signed   By: 05/16/2021 M.D.   On: 03/15/2021 23:26   DG Lumbar Spine Complete  Result Date: 03/15/2021 CLINICAL DATA:  Low back pain  following heavy lifting, initial encounter EXAM: LUMBAR SPINE - COMPLETE 4+ VIEW COMPARISON:  None. FINDINGS: There is no evidence of lumbar spine fracture. Alignment is normal. Intervertebral disc spaces are maintained.  IMPRESSION: No acute abnormality noted. Electronically Signed   By: Alcide Clever M.D.   On: 03/15/2021 23:26    Procedures Procedures   Medications Ordered in ED Medications  methocarbamol (ROBAXIN) tablet 500 mg (500 mg Oral Given 03/15/21 2236)  ketorolac (TORADOL) 30 MG/ML injection 30 mg (30 mg Intramuscular Given 03/15/21 2236)    ED Course  I have reviewed the triage vital signs and the nursing notes.  Pertinent labs & imaging results that were available during my care of the patient were reviewed by me and considered in my medical decision making (see chart for details).    MDM Rules/Calculators/A&P                          Patient presents to the emergency department today for back pain after work injury.  Patient is nontoxic-appearing, vitals are stable, does not appear as if his patient in pain, laying with no acute distress.  No pain upon ambulation.  Patient has a normal neuro exam, back pain without any red flag symptoms.  Negative leg raise.  No concerns for abscess or cord injury. Plain films are negative.  Patient most likely has a lumbar sprain, discussed this with patient.  Given Toradol and Robaxin here.  Symptomatic treatment discussed.  Will follow up with sports medicine.  Exercise provided, symptomatic treatment discussed, strict return precautions given.  Doubt need for further emergent work up at this time. I explained the diagnosis and have given explicit precautions to return to the ER including for any other new or worsening symptoms. The patient understands and accepts the medical plan as it's been dictated and I have answered their questions. Discharge instructions concerning home care and prescriptions have been given. The patient is STABLE and is  discharged to home in good condition.  Final Clinical Impression(s) / ED Diagnoses Final diagnoses:  Lumbar sprain, initial encounter    Rx / DC Orders ED Discharge Orders          Ordered    methocarbamol (ROBAXIN) 500 MG tablet  2 times daily        03/15/21 2342    naproxen (NAPROSYN) 500 MG tablet  2 times daily        03/15/21 2342             Farrel Gordon, PA-C 03/15/21 2355    Little, Ambrose [place], MD 03/16/21 [phone]

## 2021-03-15 NOTE — Discharge Instructions (Addendum)
You have a lumbar sprain, please use the attached instructions.  I want you to take the naproxen as we discussed, do not take other NSAIDs when taking this.  I also want you to take the Robaxin, do not take other muscle relaxants when taking this such as Flexeril.  Do not operate heavy machinery, drink alcohol when using this medication.  Please speak to pharmacist today about any new medications prescribed in regards to side effects or interactions with other medications.  If you have any new or worsening concerning symptom please come into the emergency department.  Please take the naproxen and the Robaxin for the next 5 days as prescribed on the bottle.  Back Pain:  Your back pain should be treated with medicines such as ibuprofen or aleve and this back pain should get better over the next 2 weeks.  However if you develop severe or worsening pain, low back pain with fever, numbness, weakness or inability to walk or urinate, you should return to the ER immediately.  Please follow up with your doctor this week for a recheck if still having symptoms.  Self - care:  The application of heat can help soothe the pain.  Maintaining your daily activities, including walking, is encourged, as it will help you get better faster than just staying in bed.  Medications are also useful to help with pain control.  A commonly prescribed medications includes acetaminophen.  This medication is generally safe, though you should not take more than 8 of the extra strength (500mg ) pills a day.  Non steroidal anti inflammatory medications including Ibuprofen and naproxen;  These medications help both pain and swelling and are very useful in treating back pain.  They should be taken with food, as they can cause stomach upset, and more seriously, stomach bleeding.    Muscle relaxants:  These medications can help with muscle tightness that is a cause of lower back pain.  Most of these medications can cause drowsiness, and it is  not safe to drive or use dangerous machinery while taking them.  You will need to follow up with  Your primary healthcare provider in 1-2 weeks for reassessment.  Be aware that if you develop new symptoms, such as a fever, leg weakness, difficulty with or loss of control of your urine or bowels, abdominal pain, or more severe pain, you will need to seek medical attention and  / or return to the Emergency department.  If you do not have a doctor see the list below.

## 2021-03-15 NOTE — ED Triage Notes (Signed)
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 She is ambulatory.

## 2021-04-05 DIAGNOSIS — F319 Bipolar disorder, unspecified: Secondary | ICD-10-CM | POA: Diagnosis not present

## 2021-04-05 DIAGNOSIS — F411 Generalized anxiety disorder: Secondary | ICD-10-CM | POA: Diagnosis not present

## 2021-04-08 DIAGNOSIS — Z419 Encounter for procedure for purposes other than remedying health state, unspecified: Secondary | ICD-10-CM | POA: Diagnosis not present

## 2021-04-17 DIAGNOSIS — N760 Acute vaginitis: Secondary | ICD-10-CM | POA: Diagnosis not present

## 2021-04-17 DIAGNOSIS — A5901 Trichomonal vulvovaginitis: Secondary | ICD-10-CM | POA: Diagnosis not present

## 2021-05-09 DIAGNOSIS — Z419 Encounter for procedure for purposes other than remedying health state, unspecified: Secondary | ICD-10-CM | POA: Diagnosis not present

## 2021-06-08 DIAGNOSIS — Z419 Encounter for procedure for purposes other than remedying health state, unspecified: Secondary | ICD-10-CM | POA: Diagnosis not present

## 2021-07-09 DIAGNOSIS — Z419 Encounter for procedure for purposes other than remedying health state, unspecified: Secondary | ICD-10-CM | POA: Diagnosis not present

## 2021-08-08 DIAGNOSIS — Z419 Encounter for procedure for purposes other than remedying health state, unspecified: Secondary | ICD-10-CM | POA: Diagnosis not present

## 2021-08-26 DIAGNOSIS — O021 Missed abortion: Secondary | ICD-10-CM | POA: Diagnosis not present

## 2021-08-26 DIAGNOSIS — Z113 Encounter for screening for infections with a predominantly sexual mode of transmission: Secondary | ICD-10-CM | POA: Diagnosis not present

## 2021-08-26 DIAGNOSIS — O3680X0 Pregnancy with inconclusive fetal viability, not applicable or unspecified: Secondary | ICD-10-CM | POA: Diagnosis not present

## 2021-08-26 DIAGNOSIS — N390 Urinary tract infection, site not specified: Secondary | ICD-10-CM | POA: Diagnosis not present

## 2021-08-28 DIAGNOSIS — O3680X0 Pregnancy with inconclusive fetal viability, not applicable or unspecified: Secondary | ICD-10-CM | POA: Diagnosis not present

## 2021-08-30 DIAGNOSIS — O021 Missed abortion: Secondary | ICD-10-CM | POA: Diagnosis not present

## 2021-09-08 DIAGNOSIS — Z419 Encounter for procedure for purposes other than remedying health state, unspecified: Secondary | ICD-10-CM | POA: Diagnosis not present

## 2021-09-13 DIAGNOSIS — O3680X0 Pregnancy with inconclusive fetal viability, not applicable or unspecified: Secondary | ICD-10-CM | POA: Diagnosis not present

## 2021-09-22 DIAGNOSIS — R300 Dysuria: Secondary | ICD-10-CM | POA: Diagnosis not present

## 2021-09-22 DIAGNOSIS — J028 Acute pharyngitis due to other specified organisms: Secondary | ICD-10-CM | POA: Diagnosis not present

## 2021-09-22 DIAGNOSIS — J029 Acute pharyngitis, unspecified: Secondary | ICD-10-CM | POA: Diagnosis not present

## 2021-09-22 DIAGNOSIS — Z3A08 8 weeks gestation of pregnancy: Secondary | ICD-10-CM | POA: Diagnosis not present

## 2021-09-23 DIAGNOSIS — Z3A09 9 weeks gestation of pregnancy: Secondary | ICD-10-CM | POA: Diagnosis not present

## 2021-09-23 DIAGNOSIS — O208 Other hemorrhage in early pregnancy: Secondary | ICD-10-CM | POA: Diagnosis not present

## 2021-09-23 DIAGNOSIS — Z3A01 Less than 8 weeks gestation of pregnancy: Secondary | ICD-10-CM | POA: Diagnosis not present

## 2021-09-23 DIAGNOSIS — O99281 Endocrine, nutritional and metabolic diseases complicating pregnancy, first trimester: Secondary | ICD-10-CM | POA: Diagnosis not present

## 2021-09-23 DIAGNOSIS — O418X10 Other specified disorders of amniotic fluid and membranes, first trimester, not applicable or unspecified: Secondary | ICD-10-CM | POA: Diagnosis not present

## 2021-09-23 DIAGNOSIS — O30031 Twin pregnancy, monochorionic/diamniotic, first trimester: Secondary | ICD-10-CM | POA: Diagnosis not present

## 2021-09-23 DIAGNOSIS — O26891 Other specified pregnancy related conditions, first trimester: Secondary | ICD-10-CM | POA: Diagnosis not present

## 2021-09-23 DIAGNOSIS — O468X1 Other antepartum hemorrhage, first trimester: Secondary | ICD-10-CM | POA: Diagnosis not present

## 2021-09-23 DIAGNOSIS — E860 Dehydration: Secondary | ICD-10-CM | POA: Diagnosis not present

## 2021-09-30 DIAGNOSIS — O30041 Twin pregnancy, dichorionic/diamniotic, first trimester: Secondary | ICD-10-CM | POA: Diagnosis not present

## 2021-09-30 DIAGNOSIS — Z3491 Encounter for supervision of normal pregnancy, unspecified, first trimester: Secondary | ICD-10-CM | POA: Diagnosis not present

## 2021-10-09 DIAGNOSIS — Z419 Encounter for procedure for purposes other than remedying health state, unspecified: Secondary | ICD-10-CM | POA: Diagnosis not present

## 2021-10-17 DIAGNOSIS — Z20822 Contact with and (suspected) exposure to covid-19: Secondary | ICD-10-CM | POA: Diagnosis not present

## 2021-10-17 DIAGNOSIS — R102 Pelvic and perineal pain: Secondary | ICD-10-CM | POA: Diagnosis not present

## 2021-10-17 DIAGNOSIS — R1032 Left lower quadrant pain: Secondary | ICD-10-CM | POA: Diagnosis not present

## 2021-10-17 DIAGNOSIS — Z3A12 12 weeks gestation of pregnancy: Secondary | ICD-10-CM | POA: Diagnosis not present

## 2021-10-17 DIAGNOSIS — R1033 Periumbilical pain: Secondary | ICD-10-CM | POA: Diagnosis not present

## 2021-10-17 DIAGNOSIS — O30009 Twin pregnancy, unspecified number of placenta and unspecified number of amniotic sacs, unspecified trimester: Secondary | ICD-10-CM | POA: Diagnosis not present

## 2021-10-17 DIAGNOSIS — R109 Unspecified abdominal pain: Secondary | ICD-10-CM | POA: Diagnosis not present

## 2021-10-17 DIAGNOSIS — O26891 Other specified pregnancy related conditions, first trimester: Secondary | ICD-10-CM | POA: Diagnosis not present

## 2021-10-17 DIAGNOSIS — R1031 Right lower quadrant pain: Secondary | ICD-10-CM | POA: Diagnosis not present

## 2021-10-17 DIAGNOSIS — Z3A00 Weeks of gestation of pregnancy not specified: Secondary | ICD-10-CM | POA: Diagnosis not present

## 2021-11-06 DIAGNOSIS — O3680X1 Pregnancy with inconclusive fetal viability, fetus 1: Secondary | ICD-10-CM | POA: Diagnosis not present

## 2021-11-06 DIAGNOSIS — O30032 Twin pregnancy, monochorionic/diamniotic, second trimester: Secondary | ICD-10-CM | POA: Diagnosis not present

## 2021-11-06 DIAGNOSIS — Z3A14 14 weeks gestation of pregnancy: Secondary | ICD-10-CM | POA: Diagnosis not present

## 2021-11-06 DIAGNOSIS — Z419 Encounter for procedure for purposes other than remedying health state, unspecified: Secondary | ICD-10-CM | POA: Diagnosis not present

## 2021-11-06 DIAGNOSIS — O3680X0 Pregnancy with inconclusive fetal viability, not applicable or unspecified: Secondary | ICD-10-CM | POA: Diagnosis not present

## 2021-11-16 DIAGNOSIS — R102 Pelvic and perineal pain: Secondary | ICD-10-CM | POA: Diagnosis not present

## 2021-11-16 DIAGNOSIS — Z3A01 Less than 8 weeks gestation of pregnancy: Secondary | ICD-10-CM | POA: Diagnosis not present

## 2021-11-16 DIAGNOSIS — O26891 Other specified pregnancy related conditions, first trimester: Secondary | ICD-10-CM | POA: Diagnosis not present

## 2021-11-16 DIAGNOSIS — O30002 Twin pregnancy, unspecified number of placenta and unspecified number of amniotic sacs, second trimester: Secondary | ICD-10-CM | POA: Diagnosis not present

## 2021-11-16 DIAGNOSIS — Z3A16 16 weeks gestation of pregnancy: Secondary | ICD-10-CM | POA: Diagnosis not present

## 2021-11-16 DIAGNOSIS — Z79899 Other long term (current) drug therapy: Secondary | ICD-10-CM | POA: Diagnosis not present

## 2021-11-16 DIAGNOSIS — Z5329 Procedure and treatment not carried out because of patient's decision for other reasons: Secondary | ICD-10-CM | POA: Diagnosis not present

## 2021-11-16 DIAGNOSIS — R1030 Lower abdominal pain, unspecified: Secondary | ICD-10-CM | POA: Diagnosis not present

## 2021-11-20 DIAGNOSIS — Z3A16 16 weeks gestation of pregnancy: Secondary | ICD-10-CM | POA: Diagnosis not present

## 2021-11-20 DIAGNOSIS — O30032 Twin pregnancy, monochorionic/diamniotic, second trimester: Secondary | ICD-10-CM | POA: Diagnosis not present

## 2021-12-06 DIAGNOSIS — F319 Bipolar disorder, unspecified: Secondary | ICD-10-CM | POA: Diagnosis not present

## 2021-12-06 DIAGNOSIS — F419 Anxiety disorder, unspecified: Secondary | ICD-10-CM | POA: Diagnosis not present

## 2021-12-06 DIAGNOSIS — O30002 Twin pregnancy, unspecified number of placenta and unspecified number of amniotic sacs, second trimester: Secondary | ICD-10-CM | POA: Diagnosis not present

## 2021-12-06 DIAGNOSIS — O09292 Supervision of pregnancy with other poor reproductive or obstetric history, second trimester: Secondary | ICD-10-CM | POA: Diagnosis not present

## 2021-12-06 DIAGNOSIS — Z3689 Encounter for other specified antenatal screening: Secondary | ICD-10-CM | POA: Diagnosis not present

## 2021-12-06 DIAGNOSIS — Z8759 Personal history of other complications of pregnancy, childbirth and the puerperium: Secondary | ICD-10-CM | POA: Diagnosis not present

## 2021-12-06 DIAGNOSIS — O321XX1 Maternal care for breech presentation, fetus 1: Secondary | ICD-10-CM | POA: Diagnosis not present

## 2021-12-06 DIAGNOSIS — O99012 Anemia complicating pregnancy, second trimester: Secondary | ICD-10-CM | POA: Diagnosis not present

## 2021-12-06 DIAGNOSIS — O99019 Anemia complicating pregnancy, unspecified trimester: Secondary | ICD-10-CM | POA: Diagnosis not present

## 2021-12-06 DIAGNOSIS — Z3A19 19 weeks gestation of pregnancy: Secondary | ICD-10-CM | POA: Diagnosis not present

## 2021-12-06 DIAGNOSIS — O99342 Other mental disorders complicating pregnancy, second trimester: Secondary | ICD-10-CM | POA: Diagnosis not present

## 2021-12-06 DIAGNOSIS — O30032 Twin pregnancy, monochorionic/diamniotic, second trimester: Secondary | ICD-10-CM | POA: Diagnosis not present

## 2021-12-06 DIAGNOSIS — D649 Anemia, unspecified: Secondary | ICD-10-CM | POA: Diagnosis not present

## 2021-12-07 DIAGNOSIS — Z419 Encounter for procedure for purposes other than remedying health state, unspecified: Secondary | ICD-10-CM | POA: Diagnosis not present

## 2021-12-26 DIAGNOSIS — Z3A21 21 weeks gestation of pregnancy: Secondary | ICD-10-CM | POA: Diagnosis not present

## 2021-12-26 DIAGNOSIS — Z28310 Unvaccinated for covid-19: Secondary | ICD-10-CM | POA: Diagnosis not present

## 2021-12-26 DIAGNOSIS — O321XX1 Maternal care for breech presentation, fetus 1: Secondary | ICD-10-CM | POA: Diagnosis not present

## 2021-12-26 DIAGNOSIS — O99342 Other mental disorders complicating pregnancy, second trimester: Secondary | ICD-10-CM | POA: Diagnosis not present

## 2021-12-26 DIAGNOSIS — O0992 Supervision of high risk pregnancy, unspecified, second trimester: Secondary | ICD-10-CM | POA: Diagnosis not present

## 2021-12-26 DIAGNOSIS — Z2821 Immunization not carried out because of patient refusal: Secondary | ICD-10-CM | POA: Diagnosis not present

## 2021-12-26 DIAGNOSIS — D649 Anemia, unspecified: Secondary | ICD-10-CM | POA: Diagnosis not present

## 2021-12-26 DIAGNOSIS — F319 Bipolar disorder, unspecified: Secondary | ICD-10-CM | POA: Diagnosis not present

## 2021-12-26 DIAGNOSIS — O09292 Supervision of pregnancy with other poor reproductive or obstetric history, second trimester: Secondary | ICD-10-CM | POA: Diagnosis not present

## 2021-12-26 DIAGNOSIS — O2622 Pregnancy care for patient with recurrent pregnancy loss, second trimester: Secondary | ICD-10-CM | POA: Diagnosis not present

## 2021-12-26 DIAGNOSIS — O30032 Twin pregnancy, monochorionic/diamniotic, second trimester: Secondary | ICD-10-CM | POA: Diagnosis not present

## 2021-12-26 DIAGNOSIS — O99012 Anemia complicating pregnancy, second trimester: Secondary | ICD-10-CM | POA: Diagnosis not present

## 2021-12-26 DIAGNOSIS — Z3689 Encounter for other specified antenatal screening: Secondary | ICD-10-CM | POA: Diagnosis not present

## 2021-12-26 DIAGNOSIS — O321XX2 Maternal care for breech presentation, fetus 2: Secondary | ICD-10-CM | POA: Diagnosis not present

## 2021-12-26 DIAGNOSIS — F419 Anxiety disorder, unspecified: Secondary | ICD-10-CM | POA: Diagnosis not present

## 2022-01-02 DIAGNOSIS — R278 Other lack of coordination: Secondary | ICD-10-CM | POA: Diagnosis not present

## 2022-01-02 DIAGNOSIS — O99891 Other specified diseases and conditions complicating pregnancy: Secondary | ICD-10-CM | POA: Diagnosis not present

## 2022-01-02 DIAGNOSIS — M549 Dorsalgia, unspecified: Secondary | ICD-10-CM | POA: Diagnosis not present

## 2022-01-02 DIAGNOSIS — R531 Weakness: Secondary | ICD-10-CM | POA: Diagnosis not present

## 2022-01-06 DIAGNOSIS — Z419 Encounter for procedure for purposes other than remedying health state, unspecified: Secondary | ICD-10-CM | POA: Diagnosis not present

## 2022-01-10 DIAGNOSIS — O30032 Twin pregnancy, monochorionic/diamniotic, second trimester: Secondary | ICD-10-CM | POA: Diagnosis not present

## 2022-01-10 DIAGNOSIS — Z3A24 24 weeks gestation of pregnancy: Secondary | ICD-10-CM | POA: Diagnosis not present

## 2022-01-10 DIAGNOSIS — O321XX1 Maternal care for breech presentation, fetus 1: Secondary | ICD-10-CM | POA: Diagnosis not present

## 2022-01-10 DIAGNOSIS — F319 Bipolar disorder, unspecified: Secondary | ICD-10-CM | POA: Diagnosis not present

## 2022-01-10 DIAGNOSIS — Z3689 Encounter for other specified antenatal screening: Secondary | ICD-10-CM | POA: Diagnosis not present

## 2022-01-10 DIAGNOSIS — O99342 Other mental disorders complicating pregnancy, second trimester: Secondary | ICD-10-CM | POA: Diagnosis not present

## 2022-01-10 DIAGNOSIS — F419 Anxiety disorder, unspecified: Secondary | ICD-10-CM | POA: Diagnosis not present

## 2022-01-10 DIAGNOSIS — O30039 Twin pregnancy, monochorionic/diamniotic, unspecified trimester: Secondary | ICD-10-CM | POA: Diagnosis not present

## 2022-01-14 DIAGNOSIS — O99340 Other mental disorders complicating pregnancy, unspecified trimester: Secondary | ICD-10-CM | POA: Diagnosis not present

## 2022-01-14 DIAGNOSIS — L299 Pruritus, unspecified: Secondary | ICD-10-CM | POA: Diagnosis not present

## 2022-01-14 DIAGNOSIS — H6981 Other specified disorders of Eustachian tube, right ear: Secondary | ICD-10-CM | POA: Diagnosis not present

## 2022-01-14 DIAGNOSIS — Z203 Contact with and (suspected) exposure to rabies: Secondary | ICD-10-CM | POA: Diagnosis not present

## 2022-01-14 DIAGNOSIS — F319 Bipolar disorder, unspecified: Secondary | ICD-10-CM | POA: Diagnosis not present

## 2022-01-24 DIAGNOSIS — O26892 Other specified pregnancy related conditions, second trimester: Secondary | ICD-10-CM | POA: Diagnosis not present

## 2022-01-24 DIAGNOSIS — D649 Anemia, unspecified: Secondary | ICD-10-CM | POA: Diagnosis not present

## 2022-01-24 DIAGNOSIS — O321XX2 Maternal care for breech presentation, fetus 2: Secondary | ICD-10-CM | POA: Diagnosis not present

## 2022-01-24 DIAGNOSIS — O99342 Other mental disorders complicating pregnancy, second trimester: Secondary | ICD-10-CM | POA: Diagnosis not present

## 2022-01-24 DIAGNOSIS — O99712 Diseases of the skin and subcutaneous tissue complicating pregnancy, second trimester: Secondary | ICD-10-CM | POA: Diagnosis not present

## 2022-01-24 DIAGNOSIS — O98112 Syphilis complicating pregnancy, second trimester: Secondary | ICD-10-CM | POA: Diagnosis not present

## 2022-01-24 DIAGNOSIS — F319 Bipolar disorder, unspecified: Secondary | ICD-10-CM | POA: Diagnosis not present

## 2022-01-24 DIAGNOSIS — O0992 Supervision of high risk pregnancy, unspecified, second trimester: Secondary | ICD-10-CM | POA: Diagnosis not present

## 2022-01-24 DIAGNOSIS — O30032 Twin pregnancy, monochorionic/diamniotic, second trimester: Secondary | ICD-10-CM | POA: Diagnosis not present

## 2022-01-24 DIAGNOSIS — Z3689 Encounter for other specified antenatal screening: Secondary | ICD-10-CM | POA: Diagnosis not present

## 2022-01-24 DIAGNOSIS — O99012 Anemia complicating pregnancy, second trimester: Secondary | ICD-10-CM | POA: Diagnosis not present

## 2022-01-24 DIAGNOSIS — O99891 Other specified diseases and conditions complicating pregnancy: Secondary | ICD-10-CM | POA: Diagnosis not present

## 2022-01-24 DIAGNOSIS — R531 Weakness: Secondary | ICD-10-CM | POA: Diagnosis not present

## 2022-01-24 DIAGNOSIS — L299 Pruritus, unspecified: Secondary | ICD-10-CM | POA: Diagnosis not present

## 2022-01-24 DIAGNOSIS — R278 Other lack of coordination: Secondary | ICD-10-CM | POA: Diagnosis not present

## 2022-01-24 DIAGNOSIS — Z3A26 26 weeks gestation of pregnancy: Secondary | ICD-10-CM | POA: Diagnosis not present

## 2022-01-24 DIAGNOSIS — F419 Anxiety disorder, unspecified: Secondary | ICD-10-CM | POA: Diagnosis not present

## 2022-01-24 DIAGNOSIS — M549 Dorsalgia, unspecified: Secondary | ICD-10-CM | POA: Diagnosis not present

## 2022-01-24 DIAGNOSIS — O321XX1 Maternal care for breech presentation, fetus 1: Secondary | ICD-10-CM | POA: Diagnosis not present

## 2022-02-06 DIAGNOSIS — A539 Syphilis, unspecified: Secondary | ICD-10-CM | POA: Diagnosis not present

## 2022-02-06 DIAGNOSIS — Z7982 Long term (current) use of aspirin: Secondary | ICD-10-CM | POA: Diagnosis not present

## 2022-02-06 DIAGNOSIS — O30032 Twin pregnancy, monochorionic/diamniotic, second trimester: Secondary | ICD-10-CM | POA: Diagnosis not present

## 2022-02-06 DIAGNOSIS — D649 Anemia, unspecified: Secondary | ICD-10-CM | POA: Diagnosis not present

## 2022-02-06 DIAGNOSIS — Z3A27 27 weeks gestation of pregnancy: Secondary | ICD-10-CM | POA: Diagnosis not present

## 2022-02-06 DIAGNOSIS — O99342 Other mental disorders complicating pregnancy, second trimester: Secondary | ICD-10-CM | POA: Diagnosis not present

## 2022-02-06 DIAGNOSIS — Z419 Encounter for procedure for purposes other than remedying health state, unspecified: Secondary | ICD-10-CM | POA: Diagnosis not present

## 2022-02-06 DIAGNOSIS — O98112 Syphilis complicating pregnancy, second trimester: Secondary | ICD-10-CM | POA: Diagnosis not present

## 2022-02-06 DIAGNOSIS — Z79899 Other long term (current) drug therapy: Secondary | ICD-10-CM | POA: Diagnosis not present

## 2022-02-06 DIAGNOSIS — O322XX1 Maternal care for transverse and oblique lie, fetus 1: Secondary | ICD-10-CM | POA: Diagnosis not present

## 2022-02-06 DIAGNOSIS — F319 Bipolar disorder, unspecified: Secondary | ICD-10-CM | POA: Diagnosis not present

## 2022-02-06 DIAGNOSIS — O322XX2 Maternal care for transverse and oblique lie, fetus 2: Secondary | ICD-10-CM | POA: Diagnosis not present

## 2022-02-06 DIAGNOSIS — F419 Anxiety disorder, unspecified: Secondary | ICD-10-CM | POA: Diagnosis not present

## 2022-02-06 DIAGNOSIS — O99012 Anemia complicating pregnancy, second trimester: Secondary | ICD-10-CM | POA: Diagnosis not present

## 2022-02-06 DIAGNOSIS — O0992 Supervision of high risk pregnancy, unspecified, second trimester: Secondary | ICD-10-CM | POA: Diagnosis not present

## 2022-02-06 DIAGNOSIS — Z3689 Encounter for other specified antenatal screening: Secondary | ICD-10-CM | POA: Diagnosis not present

## 2022-02-10 DIAGNOSIS — O30043 Twin pregnancy, dichorionic/diamniotic, third trimester: Secondary | ICD-10-CM | POA: Diagnosis not present

## 2022-02-10 DIAGNOSIS — O99013 Anemia complicating pregnancy, third trimester: Secondary | ICD-10-CM | POA: Diagnosis not present

## 2022-02-10 DIAGNOSIS — O4703 False labor before 37 completed weeks of gestation, third trimester: Secondary | ICD-10-CM | POA: Diagnosis not present

## 2022-02-10 DIAGNOSIS — Z3A28 28 weeks gestation of pregnancy: Secondary | ICD-10-CM | POA: Diagnosis not present

## 2022-02-10 DIAGNOSIS — D509 Iron deficiency anemia, unspecified: Secondary | ICD-10-CM | POA: Diagnosis not present

## 2022-02-14 DIAGNOSIS — Z041 Encounter for examination and observation following transport accident: Secondary | ICD-10-CM | POA: Diagnosis not present

## 2022-02-14 DIAGNOSIS — O26893 Other specified pregnancy related conditions, third trimester: Secondary | ICD-10-CM | POA: Diagnosis not present

## 2022-02-14 DIAGNOSIS — R1030 Lower abdominal pain, unspecified: Secondary | ICD-10-CM | POA: Diagnosis not present

## 2022-02-14 DIAGNOSIS — Z3A29 29 weeks gestation of pregnancy: Secondary | ICD-10-CM | POA: Diagnosis not present

## 2022-02-15 DIAGNOSIS — O26893 Other specified pregnancy related conditions, third trimester: Secondary | ICD-10-CM | POA: Diagnosis not present

## 2022-02-15 DIAGNOSIS — Z3A29 29 weeks gestation of pregnancy: Secondary | ICD-10-CM | POA: Diagnosis not present

## 2022-02-15 DIAGNOSIS — Z041 Encounter for examination and observation following transport accident: Secondary | ICD-10-CM | POA: Diagnosis not present

## 2022-02-15 DIAGNOSIS — R1030 Lower abdominal pain, unspecified: Secondary | ICD-10-CM | POA: Diagnosis not present

## 2022-02-21 DIAGNOSIS — O30033 Twin pregnancy, monochorionic/diamniotic, third trimester: Secondary | ICD-10-CM | POA: Diagnosis not present

## 2022-02-21 DIAGNOSIS — O98113 Syphilis complicating pregnancy, third trimester: Secondary | ICD-10-CM | POA: Diagnosis not present

## 2022-02-21 DIAGNOSIS — O09893 Supervision of other high risk pregnancies, third trimester: Secondary | ICD-10-CM | POA: Diagnosis not present

## 2022-02-21 DIAGNOSIS — O99013 Anemia complicating pregnancy, third trimester: Secondary | ICD-10-CM | POA: Diagnosis not present

## 2022-02-21 DIAGNOSIS — Z23 Encounter for immunization: Secondary | ICD-10-CM | POA: Diagnosis not present

## 2022-02-21 DIAGNOSIS — O99343 Other mental disorders complicating pregnancy, third trimester: Secondary | ICD-10-CM | POA: Diagnosis not present

## 2022-02-21 DIAGNOSIS — F419 Anxiety disorder, unspecified: Secondary | ICD-10-CM | POA: Diagnosis not present

## 2022-02-21 DIAGNOSIS — Z3689 Encounter for other specified antenatal screening: Secondary | ICD-10-CM | POA: Diagnosis not present

## 2022-02-21 DIAGNOSIS — F319 Bipolar disorder, unspecified: Secondary | ICD-10-CM | POA: Diagnosis not present

## 2022-02-21 DIAGNOSIS — O0993 Supervision of high risk pregnancy, unspecified, third trimester: Secondary | ICD-10-CM | POA: Diagnosis not present

## 2022-02-21 DIAGNOSIS — D649 Anemia, unspecified: Secondary | ICD-10-CM | POA: Diagnosis not present

## 2022-02-21 DIAGNOSIS — Z3A30 30 weeks gestation of pregnancy: Secondary | ICD-10-CM | POA: Diagnosis not present

## 2022-02-25 DIAGNOSIS — Z3A31 31 weeks gestation of pregnancy: Secondary | ICD-10-CM | POA: Diagnosis not present

## 2022-02-25 DIAGNOSIS — R079 Chest pain, unspecified: Secondary | ICD-10-CM | POA: Diagnosis not present

## 2022-02-25 DIAGNOSIS — O2693 Pregnancy related conditions, unspecified, third trimester: Secondary | ICD-10-CM | POA: Diagnosis not present

## 2022-02-25 DIAGNOSIS — Z886 Allergy status to analgesic agent status: Secondary | ICD-10-CM | POA: Diagnosis not present

## 2022-02-26 DIAGNOSIS — R079 Chest pain, unspecified: Secondary | ICD-10-CM | POA: Diagnosis not present

## 2022-03-07 DIAGNOSIS — F419 Anxiety disorder, unspecified: Secondary | ICD-10-CM | POA: Diagnosis not present

## 2022-03-07 DIAGNOSIS — O0993 Supervision of high risk pregnancy, unspecified, third trimester: Secondary | ICD-10-CM | POA: Diagnosis not present

## 2022-03-07 DIAGNOSIS — O98113 Syphilis complicating pregnancy, third trimester: Secondary | ICD-10-CM | POA: Diagnosis not present

## 2022-03-07 DIAGNOSIS — O99343 Other mental disorders complicating pregnancy, third trimester: Secondary | ICD-10-CM | POA: Diagnosis not present

## 2022-03-07 DIAGNOSIS — Z3A30 30 weeks gestation of pregnancy: Secondary | ICD-10-CM | POA: Diagnosis not present

## 2022-03-07 DIAGNOSIS — Z3A32 32 weeks gestation of pregnancy: Secondary | ICD-10-CM | POA: Diagnosis not present

## 2022-03-07 DIAGNOSIS — F319 Bipolar disorder, unspecified: Secondary | ICD-10-CM | POA: Diagnosis not present

## 2022-03-07 DIAGNOSIS — Z3689 Encounter for other specified antenatal screening: Secondary | ICD-10-CM | POA: Diagnosis not present

## 2022-03-07 DIAGNOSIS — O30033 Twin pregnancy, monochorionic/diamniotic, third trimester: Secondary | ICD-10-CM | POA: Diagnosis not present

## 2022-03-08 DIAGNOSIS — Z419 Encounter for procedure for purposes other than remedying health state, unspecified: Secondary | ICD-10-CM | POA: Diagnosis not present

## 2022-03-14 DIAGNOSIS — Z3A33 33 weeks gestation of pregnancy: Secondary | ICD-10-CM | POA: Diagnosis not present

## 2022-03-14 DIAGNOSIS — F319 Bipolar disorder, unspecified: Secondary | ICD-10-CM | POA: Diagnosis not present

## 2022-03-14 DIAGNOSIS — O99343 Other mental disorders complicating pregnancy, third trimester: Secondary | ICD-10-CM | POA: Diagnosis not present

## 2022-03-14 DIAGNOSIS — F419 Anxiety disorder, unspecified: Secondary | ICD-10-CM | POA: Diagnosis not present

## 2022-03-14 DIAGNOSIS — Z3689 Encounter for other specified antenatal screening: Secondary | ICD-10-CM | POA: Diagnosis not present

## 2022-03-14 DIAGNOSIS — O0993 Supervision of high risk pregnancy, unspecified, third trimester: Secondary | ICD-10-CM | POA: Diagnosis not present

## 2022-03-14 DIAGNOSIS — O30033 Twin pregnancy, monochorionic/diamniotic, third trimester: Secondary | ICD-10-CM | POA: Diagnosis not present

## 2022-03-21 DIAGNOSIS — O30033 Twin pregnancy, monochorionic/diamniotic, third trimester: Secondary | ICD-10-CM | POA: Diagnosis not present

## 2022-03-21 DIAGNOSIS — K219 Gastro-esophageal reflux disease without esophagitis: Secondary | ICD-10-CM | POA: Diagnosis not present

## 2022-03-21 DIAGNOSIS — O99613 Diseases of the digestive system complicating pregnancy, third trimester: Secondary | ICD-10-CM | POA: Diagnosis not present

## 2022-03-21 DIAGNOSIS — R519 Headache, unspecified: Secondary | ICD-10-CM | POA: Diagnosis not present

## 2022-03-21 DIAGNOSIS — O26893 Other specified pregnancy related conditions, third trimester: Secondary | ICD-10-CM | POA: Diagnosis not present

## 2022-03-21 DIAGNOSIS — O99013 Anemia complicating pregnancy, third trimester: Secondary | ICD-10-CM | POA: Diagnosis not present

## 2022-03-21 DIAGNOSIS — F319 Bipolar disorder, unspecified: Secondary | ICD-10-CM | POA: Diagnosis not present

## 2022-03-21 DIAGNOSIS — D509 Iron deficiency anemia, unspecified: Secondary | ICD-10-CM | POA: Diagnosis not present

## 2022-03-21 DIAGNOSIS — Z8669 Personal history of other diseases of the nervous system and sense organs: Secondary | ICD-10-CM | POA: Diagnosis not present

## 2022-03-21 DIAGNOSIS — O98113 Syphilis complicating pregnancy, third trimester: Secondary | ICD-10-CM | POA: Diagnosis not present

## 2022-03-21 DIAGNOSIS — A539 Syphilis, unspecified: Secondary | ICD-10-CM | POA: Diagnosis not present

## 2022-03-21 DIAGNOSIS — F419 Anxiety disorder, unspecified: Secondary | ICD-10-CM | POA: Diagnosis not present

## 2022-03-21 DIAGNOSIS — O09893 Supervision of other high risk pregnancies, third trimester: Secondary | ICD-10-CM | POA: Diagnosis not present

## 2022-03-21 DIAGNOSIS — O99343 Other mental disorders complicating pregnancy, third trimester: Secondary | ICD-10-CM | POA: Diagnosis not present

## 2022-03-21 DIAGNOSIS — Z3A34 34 weeks gestation of pregnancy: Secondary | ICD-10-CM | POA: Diagnosis not present

## 2022-03-21 DIAGNOSIS — Z3689 Encounter for other specified antenatal screening: Secondary | ICD-10-CM | POA: Diagnosis not present

## 2022-03-23 DIAGNOSIS — O2243 Hemorrhoids in pregnancy, third trimester: Secondary | ICD-10-CM | POA: Diagnosis not present

## 2022-03-23 DIAGNOSIS — Z3A34 34 weeks gestation of pregnancy: Secondary | ICD-10-CM | POA: Diagnosis not present

## 2022-03-23 DIAGNOSIS — R300 Dysuria: Secondary | ICD-10-CM | POA: Diagnosis not present

## 2022-03-28 DIAGNOSIS — Z3A35 35 weeks gestation of pregnancy: Secondary | ICD-10-CM | POA: Diagnosis not present

## 2022-03-28 DIAGNOSIS — O0993 Supervision of high risk pregnancy, unspecified, third trimester: Secondary | ICD-10-CM | POA: Diagnosis not present

## 2022-03-28 DIAGNOSIS — F419 Anxiety disorder, unspecified: Secondary | ICD-10-CM | POA: Diagnosis not present

## 2022-03-28 DIAGNOSIS — Z3689 Encounter for other specified antenatal screening: Secondary | ICD-10-CM | POA: Diagnosis not present

## 2022-03-28 DIAGNOSIS — O0943 Supervision of pregnancy with grand multiparity, third trimester: Secondary | ICD-10-CM | POA: Diagnosis not present

## 2022-03-28 DIAGNOSIS — O99343 Other mental disorders complicating pregnancy, third trimester: Secondary | ICD-10-CM | POA: Diagnosis not present

## 2022-03-28 DIAGNOSIS — O30033 Twin pregnancy, monochorionic/diamniotic, third trimester: Secondary | ICD-10-CM | POA: Diagnosis not present

## 2022-03-28 DIAGNOSIS — F319 Bipolar disorder, unspecified: Secondary | ICD-10-CM | POA: Diagnosis not present

## 2022-04-04 DIAGNOSIS — O99343 Other mental disorders complicating pregnancy, third trimester: Secondary | ICD-10-CM | POA: Diagnosis not present

## 2022-04-04 DIAGNOSIS — Z3A36 36 weeks gestation of pregnancy: Secondary | ICD-10-CM | POA: Diagnosis not present

## 2022-04-04 DIAGNOSIS — Z9189 Other specified personal risk factors, not elsewhere classified: Secondary | ICD-10-CM | POA: Diagnosis not present

## 2022-04-04 DIAGNOSIS — O98113 Syphilis complicating pregnancy, third trimester: Secondary | ICD-10-CM | POA: Diagnosis not present

## 2022-04-04 DIAGNOSIS — F319 Bipolar disorder, unspecified: Secondary | ICD-10-CM | POA: Diagnosis not present

## 2022-04-04 DIAGNOSIS — A539 Syphilis, unspecified: Secondary | ICD-10-CM | POA: Diagnosis not present

## 2022-04-04 DIAGNOSIS — Z3689 Encounter for other specified antenatal screening: Secondary | ICD-10-CM | POA: Diagnosis not present

## 2022-04-04 DIAGNOSIS — O30033 Twin pregnancy, monochorionic/diamniotic, third trimester: Secondary | ICD-10-CM | POA: Diagnosis not present

## 2022-04-04 DIAGNOSIS — D649 Anemia, unspecified: Secondary | ICD-10-CM | POA: Diagnosis not present

## 2022-04-04 DIAGNOSIS — F419 Anxiety disorder, unspecified: Secondary | ICD-10-CM | POA: Diagnosis not present

## 2022-04-04 DIAGNOSIS — O99013 Anemia complicating pregnancy, third trimester: Secondary | ICD-10-CM | POA: Diagnosis not present

## 2022-04-08 DIAGNOSIS — Z419 Encounter for procedure for purposes other than remedying health state, unspecified: Secondary | ICD-10-CM | POA: Diagnosis not present

## 2022-04-10 DIAGNOSIS — Z3A36 36 weeks gestation of pregnancy: Secondary | ICD-10-CM | POA: Diagnosis not present

## 2022-04-10 DIAGNOSIS — Z9189 Other specified personal risk factors, not elsewhere classified: Secondary | ICD-10-CM | POA: Diagnosis not present

## 2022-04-10 DIAGNOSIS — F319 Bipolar disorder, unspecified: Secondary | ICD-10-CM | POA: Diagnosis not present

## 2022-04-10 DIAGNOSIS — O99343 Other mental disorders complicating pregnancy, third trimester: Secondary | ICD-10-CM | POA: Diagnosis not present

## 2022-04-11 DIAGNOSIS — O99344 Other mental disorders complicating childbirth: Secondary | ICD-10-CM | POA: Diagnosis not present

## 2022-04-11 DIAGNOSIS — F419 Anxiety disorder, unspecified: Secondary | ICD-10-CM | POA: Diagnosis not present

## 2022-04-11 DIAGNOSIS — O09893 Supervision of other high risk pregnancies, third trimester: Secondary | ICD-10-CM | POA: Diagnosis not present

## 2022-04-11 DIAGNOSIS — O30033 Twin pregnancy, monochorionic/diamniotic, third trimester: Secondary | ICD-10-CM | POA: Diagnosis not present

## 2022-04-11 DIAGNOSIS — Z3689 Encounter for other specified antenatal screening: Secondary | ICD-10-CM | POA: Diagnosis not present

## 2022-04-11 DIAGNOSIS — Z7982 Long term (current) use of aspirin: Secondary | ICD-10-CM | POA: Diagnosis not present

## 2022-04-11 DIAGNOSIS — O99343 Other mental disorders complicating pregnancy, third trimester: Secondary | ICD-10-CM | POA: Diagnosis not present

## 2022-04-11 DIAGNOSIS — O2623 Pregnancy care for patient with recurrent pregnancy loss, third trimester: Secondary | ICD-10-CM | POA: Diagnosis not present

## 2022-04-11 DIAGNOSIS — O322XX2 Maternal care for transverse and oblique lie, fetus 2: Secondary | ICD-10-CM | POA: Diagnosis not present

## 2022-04-11 DIAGNOSIS — T7631XA Adult psychological abuse, suspected, initial encounter: Secondary | ICD-10-CM | POA: Diagnosis not present

## 2022-04-11 DIAGNOSIS — F319 Bipolar disorder, unspecified: Secondary | ICD-10-CM | POA: Diagnosis not present

## 2022-04-11 DIAGNOSIS — O321XX0 Maternal care for breech presentation, not applicable or unspecified: Secondary | ICD-10-CM | POA: Diagnosis not present

## 2022-04-11 DIAGNOSIS — Z3A37 37 weeks gestation of pregnancy: Secondary | ICD-10-CM | POA: Diagnosis not present

## 2022-04-12 DIAGNOSIS — Z3A37 37 weeks gestation of pregnancy: Secondary | ICD-10-CM | POA: Diagnosis not present

## 2022-04-12 DIAGNOSIS — O30002 Twin pregnancy, unspecified number of placenta and unspecified number of amniotic sacs, second trimester: Secondary | ICD-10-CM | POA: Diagnosis not present

## 2022-04-12 DIAGNOSIS — O30033 Twin pregnancy, monochorionic/diamniotic, third trimester: Secondary | ICD-10-CM | POA: Diagnosis not present

## 2022-04-12 DIAGNOSIS — O43893 Other placental disorders, third trimester: Secondary | ICD-10-CM | POA: Diagnosis not present

## 2022-04-28 DIAGNOSIS — F319 Bipolar disorder, unspecified: Secondary | ICD-10-CM | POA: Diagnosis not present

## 2022-04-28 DIAGNOSIS — O99345 Other mental disorders complicating the puerperium: Secondary | ICD-10-CM | POA: Diagnosis not present

## 2022-05-05 DIAGNOSIS — F319 Bipolar disorder, unspecified: Secondary | ICD-10-CM | POA: Diagnosis not present

## 2022-05-09 DIAGNOSIS — Z419 Encounter for procedure for purposes other than remedying health state, unspecified: Secondary | ICD-10-CM | POA: Diagnosis not present

## 2022-05-19 DIAGNOSIS — O99345 Other mental disorders complicating the puerperium: Secondary | ICD-10-CM | POA: Diagnosis not present

## 2022-05-19 DIAGNOSIS — F319 Bipolar disorder, unspecified: Secondary | ICD-10-CM | POA: Diagnosis not present

## 2022-05-26 DIAGNOSIS — Z124 Encounter for screening for malignant neoplasm of cervix: Secondary | ICD-10-CM | POA: Diagnosis not present

## 2022-05-26 DIAGNOSIS — Z1332 Encounter for screening for maternal depression: Secondary | ICD-10-CM | POA: Diagnosis not present

## 2022-06-02 DIAGNOSIS — Z79899 Other long term (current) drug therapy: Secondary | ICD-10-CM | POA: Diagnosis not present

## 2022-06-02 DIAGNOSIS — F319 Bipolar disorder, unspecified: Secondary | ICD-10-CM | POA: Diagnosis not present

## 2022-06-02 DIAGNOSIS — O99345 Other mental disorders complicating the puerperium: Secondary | ICD-10-CM | POA: Diagnosis not present

## 2022-06-02 DIAGNOSIS — F419 Anxiety disorder, unspecified: Secondary | ICD-10-CM | POA: Diagnosis not present

## 2022-06-08 DIAGNOSIS — Z419 Encounter for procedure for purposes other than remedying health state, unspecified: Secondary | ICD-10-CM | POA: Diagnosis not present

## 2022-06-18 DIAGNOSIS — K582 Mixed irritable bowel syndrome: Secondary | ICD-10-CM | POA: Diagnosis not present

## 2022-07-09 DIAGNOSIS — Z419 Encounter for procedure for purposes other than remedying health state, unspecified: Secondary | ICD-10-CM | POA: Diagnosis not present

## 2022-07-30 DIAGNOSIS — B349 Viral infection, unspecified: Secondary | ICD-10-CM | POA: Diagnosis not present

## 2022-07-30 DIAGNOSIS — Z20822 Contact with and (suspected) exposure to covid-19: Secondary | ICD-10-CM | POA: Diagnosis not present

## 2022-08-08 DIAGNOSIS — Z419 Encounter for procedure for purposes other than remedying health state, unspecified: Secondary | ICD-10-CM | POA: Diagnosis not present

## 2022-09-08 DIAGNOSIS — Z419 Encounter for procedure for purposes other than remedying health state, unspecified: Secondary | ICD-10-CM | POA: Diagnosis not present

## 2022-09-23 DIAGNOSIS — F319 Bipolar disorder, unspecified: Secondary | ICD-10-CM | POA: Diagnosis not present

## 2022-09-23 DIAGNOSIS — Z79899 Other long term (current) drug therapy: Secondary | ICD-10-CM | POA: Diagnosis not present

## 2022-10-02 DIAGNOSIS — Z9189 Other specified personal risk factors, not elsewhere classified: Secondary | ICD-10-CM | POA: Diagnosis not present

## 2022-10-02 DIAGNOSIS — F319 Bipolar disorder, unspecified: Secondary | ICD-10-CM | POA: Diagnosis not present

## 2022-10-02 DIAGNOSIS — F411 Generalized anxiety disorder: Secondary | ICD-10-CM | POA: Diagnosis not present

## 2022-10-09 DIAGNOSIS — Z419 Encounter for procedure for purposes other than remedying health state, unspecified: Secondary | ICD-10-CM | POA: Diagnosis not present

## 2022-10-23 DIAGNOSIS — Z3201 Encounter for pregnancy test, result positive: Secondary | ICD-10-CM | POA: Diagnosis not present

## 2022-10-23 DIAGNOSIS — R21 Rash and other nonspecific skin eruption: Secondary | ICD-10-CM | POA: Diagnosis not present

## 2022-10-27 DIAGNOSIS — Z3201 Encounter for pregnancy test, result positive: Secondary | ICD-10-CM | POA: Diagnosis not present

## 2022-11-02 DIAGNOSIS — R197 Diarrhea, unspecified: Secondary | ICD-10-CM | POA: Diagnosis not present

## 2022-11-02 DIAGNOSIS — O219 Vomiting of pregnancy, unspecified: Secondary | ICD-10-CM | POA: Diagnosis not present

## 2022-11-02 DIAGNOSIS — R109 Unspecified abdominal pain: Secondary | ICD-10-CM | POA: Diagnosis not present

## 2022-11-02 DIAGNOSIS — Z3A01 Less than 8 weeks gestation of pregnancy: Secondary | ICD-10-CM | POA: Diagnosis not present

## 2022-11-02 DIAGNOSIS — O26891 Other specified pregnancy related conditions, first trimester: Secondary | ICD-10-CM | POA: Diagnosis not present

## 2022-11-02 DIAGNOSIS — Z1152 Encounter for screening for COVID-19: Secondary | ICD-10-CM | POA: Diagnosis not present

## 2022-11-05 DIAGNOSIS — Z3491 Encounter for supervision of normal pregnancy, unspecified, first trimester: Secondary | ICD-10-CM | POA: Diagnosis not present

## 2022-11-05 DIAGNOSIS — Z3689 Encounter for other specified antenatal screening: Secondary | ICD-10-CM | POA: Diagnosis not present

## 2022-11-05 DIAGNOSIS — O2621 Pregnancy care for patient with recurrent pregnancy loss, first trimester: Secondary | ICD-10-CM | POA: Diagnosis not present

## 2022-11-05 DIAGNOSIS — Z3A01 Less than 8 weeks gestation of pregnancy: Secondary | ICD-10-CM | POA: Diagnosis not present

## 2022-11-07 DIAGNOSIS — Z419 Encounter for procedure for purposes other than remedying health state, unspecified: Secondary | ICD-10-CM | POA: Diagnosis not present

## 2022-11-14 DIAGNOSIS — F319 Bipolar disorder, unspecified: Secondary | ICD-10-CM | POA: Diagnosis not present

## 2022-11-14 DIAGNOSIS — F411 Generalized anxiety disorder: Secondary | ICD-10-CM | POA: Diagnosis not present

## 2022-11-14 DIAGNOSIS — Z91414 Personal history of adult intimate partner abuse: Secondary | ICD-10-CM | POA: Diagnosis not present

## 2022-11-24 DIAGNOSIS — F411 Generalized anxiety disorder: Secondary | ICD-10-CM | POA: Diagnosis not present

## 2022-11-24 DIAGNOSIS — Z91414 Personal history of adult intimate partner abuse: Secondary | ICD-10-CM | POA: Diagnosis not present

## 2022-11-24 DIAGNOSIS — F319 Bipolar disorder, unspecified: Secondary | ICD-10-CM | POA: Diagnosis not present

## 2022-12-03 DIAGNOSIS — Z3481 Encounter for supervision of other normal pregnancy, first trimester: Secondary | ICD-10-CM | POA: Diagnosis not present

## 2022-12-08 DIAGNOSIS — Z419 Encounter for procedure for purposes other than remedying health state, unspecified: Secondary | ICD-10-CM | POA: Diagnosis not present

## 2022-12-16 DIAGNOSIS — F411 Generalized anxiety disorder: Secondary | ICD-10-CM | POA: Diagnosis not present

## 2022-12-16 DIAGNOSIS — F319 Bipolar disorder, unspecified: Secondary | ICD-10-CM | POA: Diagnosis not present

## 2022-12-29 DIAGNOSIS — Z3492 Encounter for supervision of normal pregnancy, unspecified, second trimester: Secondary | ICD-10-CM | POA: Diagnosis not present

## 2023-01-05 ENCOUNTER — Telehealth: Payer: Self-pay

## 2023-01-05 NOTE — Telephone Encounter (Signed)
Called patient to schedule apt. No working #. AS, CMA

## 2023-01-07 DIAGNOSIS — Z419 Encounter for procedure for purposes other than remedying health state, unspecified: Secondary | ICD-10-CM | POA: Diagnosis not present

## 2023-01-13 DIAGNOSIS — Z91414 Personal history of adult intimate partner abuse: Secondary | ICD-10-CM | POA: Diagnosis not present

## 2023-01-13 DIAGNOSIS — F319 Bipolar disorder, unspecified: Secondary | ICD-10-CM | POA: Diagnosis not present

## 2023-01-13 DIAGNOSIS — F411 Generalized anxiety disorder: Secondary | ICD-10-CM | POA: Diagnosis not present

## 2023-01-26 DIAGNOSIS — Z361 Encounter for antenatal screening for raised alphafetoprotein level: Secondary | ICD-10-CM | POA: Diagnosis not present

## 2023-02-07 DIAGNOSIS — Z419 Encounter for procedure for purposes other than remedying health state, unspecified: Secondary | ICD-10-CM | POA: Diagnosis not present

## 2023-02-17 IMAGING — CR DG THORACIC SPINE 2V
2 series · 2 of 2 positions shown · non-contrast
Comparison: None.

CLINICAL DATA: Back pain following heavy lifting, initial encounter

EXAM:
THORACIC SPINE 2 VIEWS

[t t-spine a.p.]
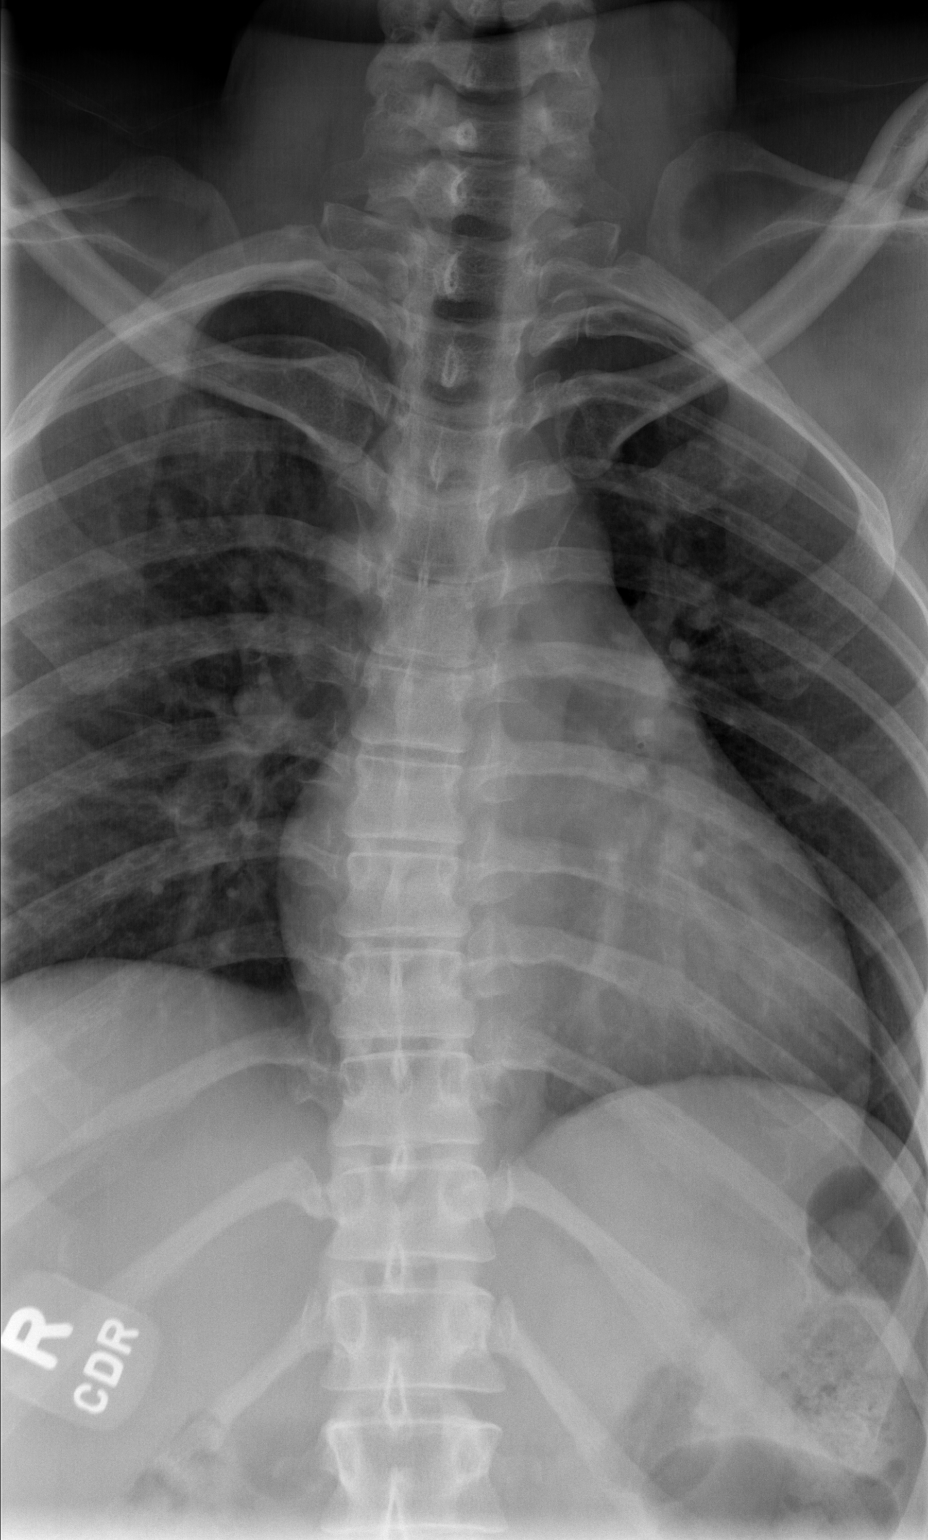

[t t-spine lat *]
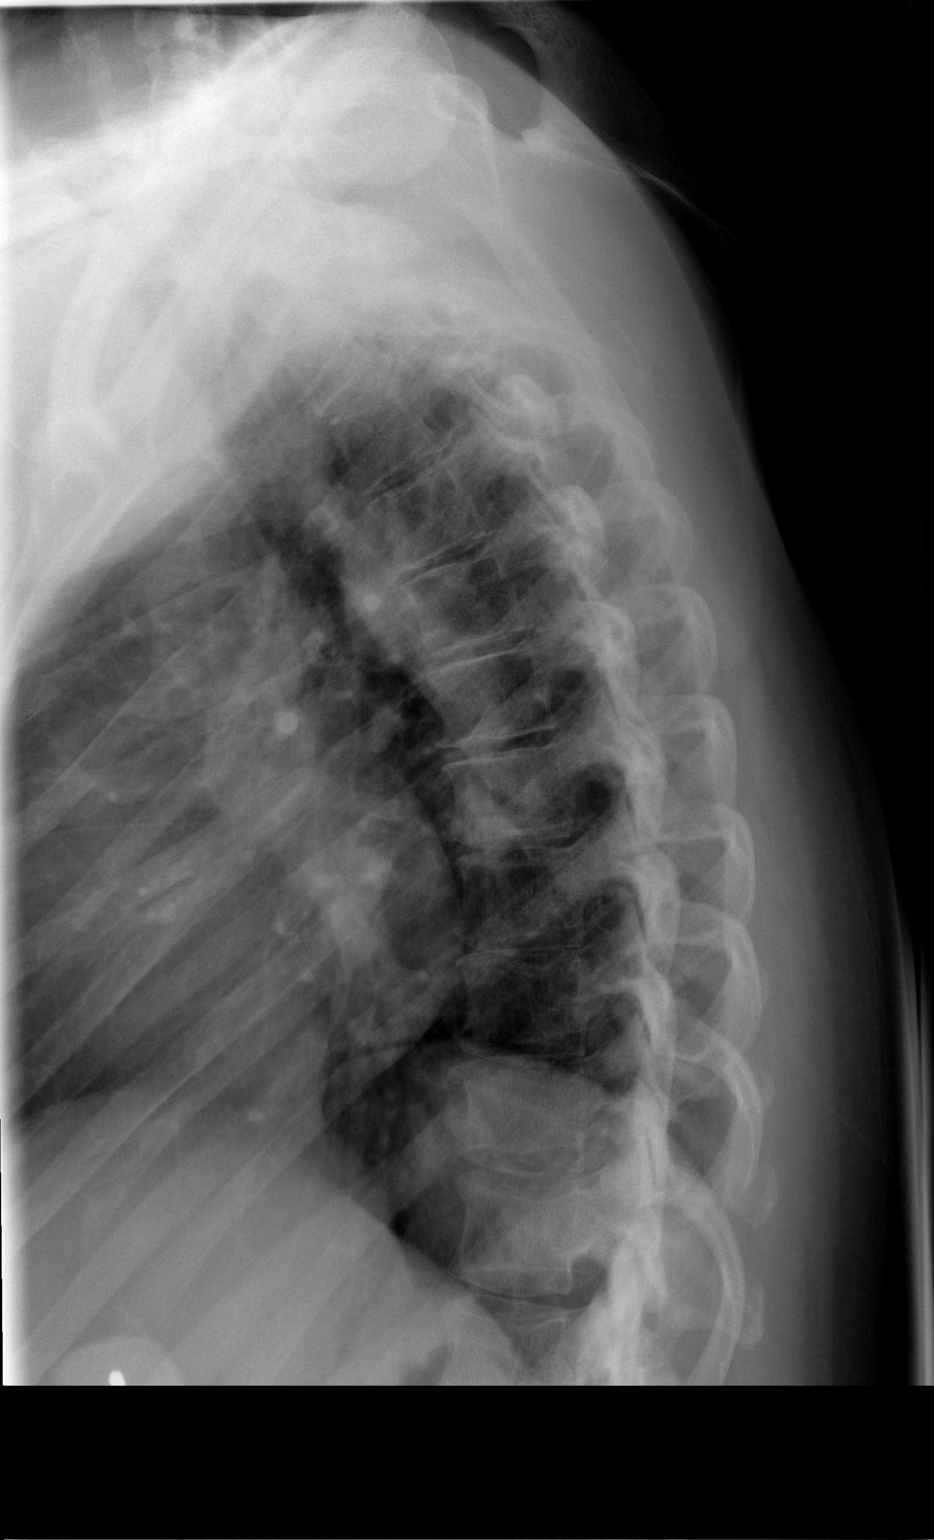

[2 of 2 positions shown; findings below may reference images not displayed]

FINDINGS: There is no evidence of thoracic spine fracture. Alignment is
normal. No other significant bone abnormalities are identified.
IMPRESSION: No acute abnormality noted.

## 2023-02-17 IMAGING — CR DG LUMBAR SPINE COMPLETE 4+V
5 series · 5 of 5 positions shown · non-contrast
Comparison: None.

CLINICAL DATA: Low back pain following heavy lifting, initial
encounter

EXAM:
LUMBAR SPINE - COMPLETE 4+ VIEW

[t l-spine a.p.]
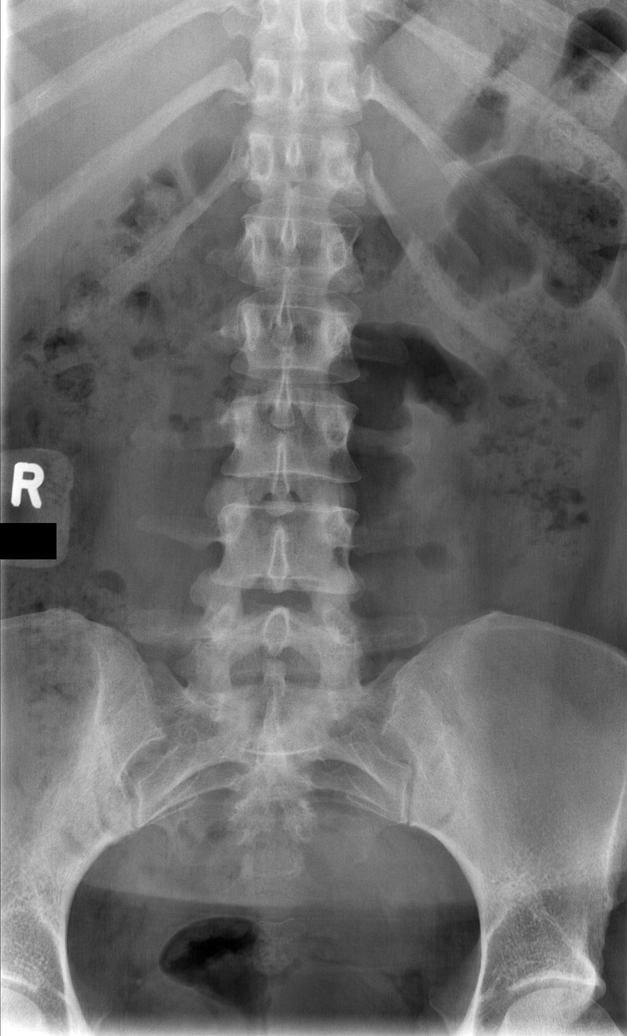

[t l-spine oblique exposure (1 of 2)]
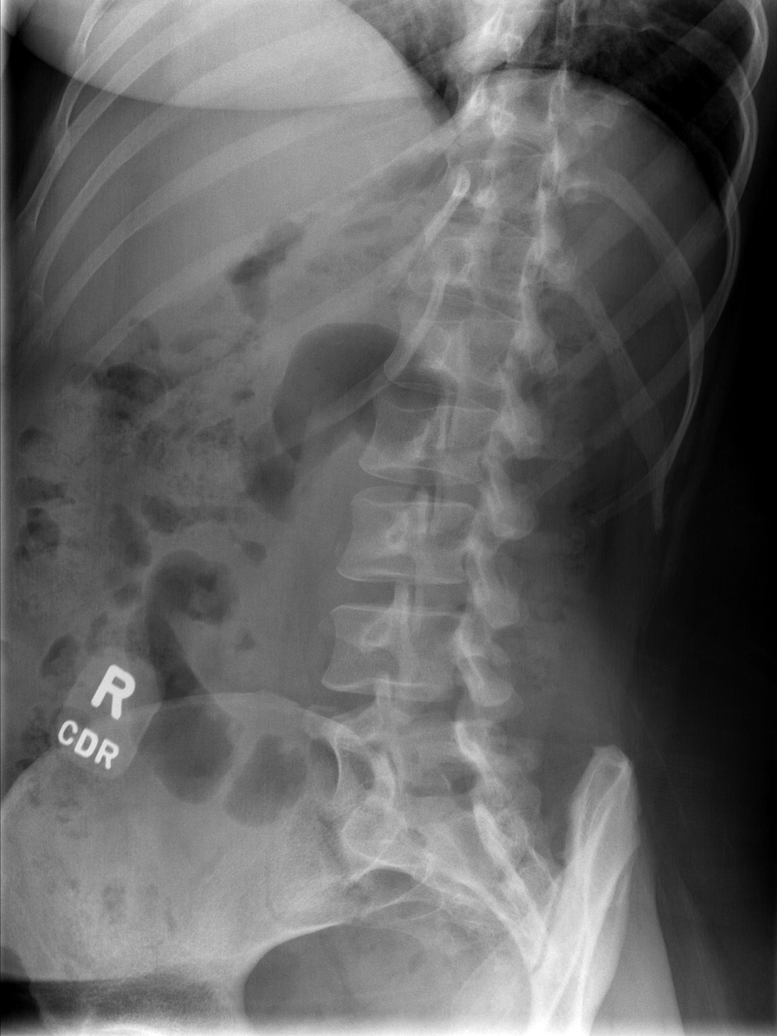

[t l-spine oblique exposure (2 of 2)]
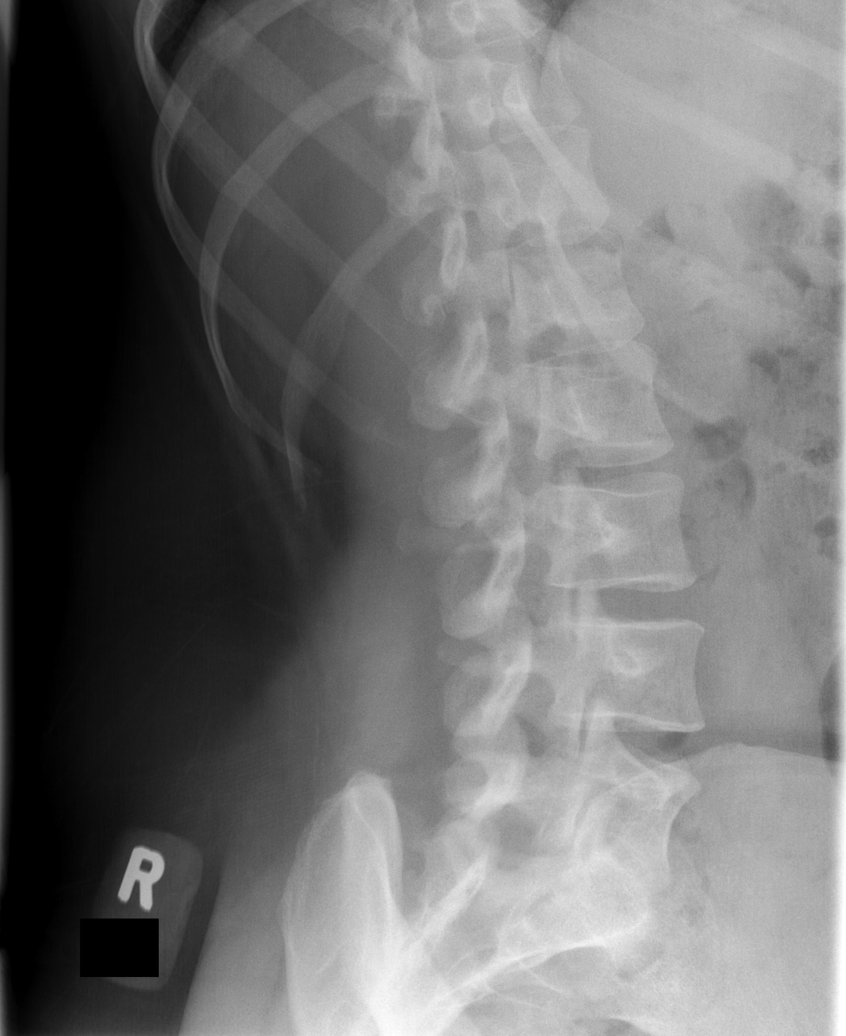

[t l-spine lat]
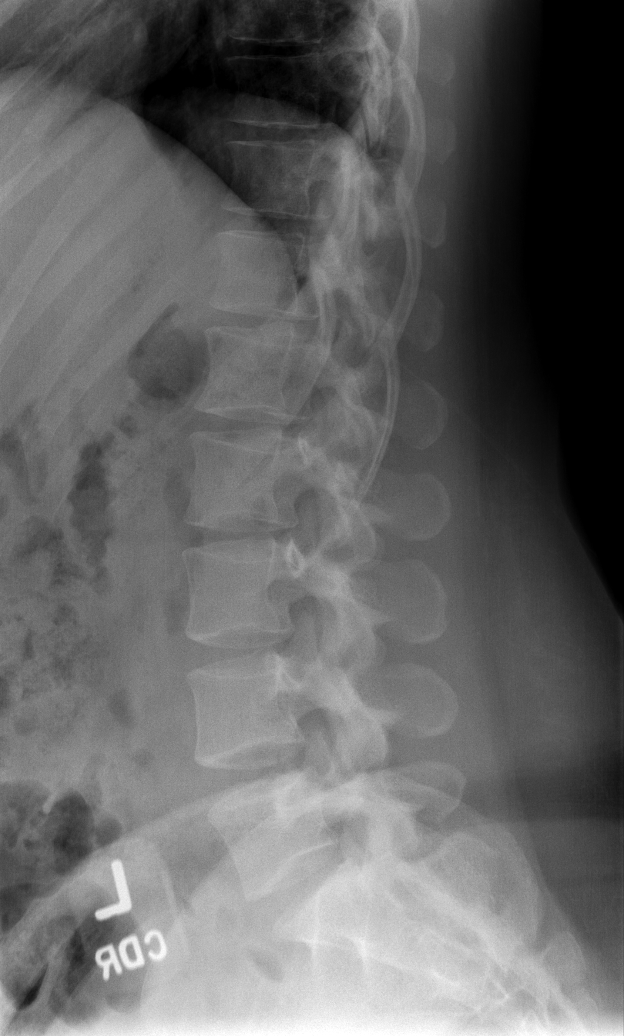

[t l-spine l5-s1 spot]
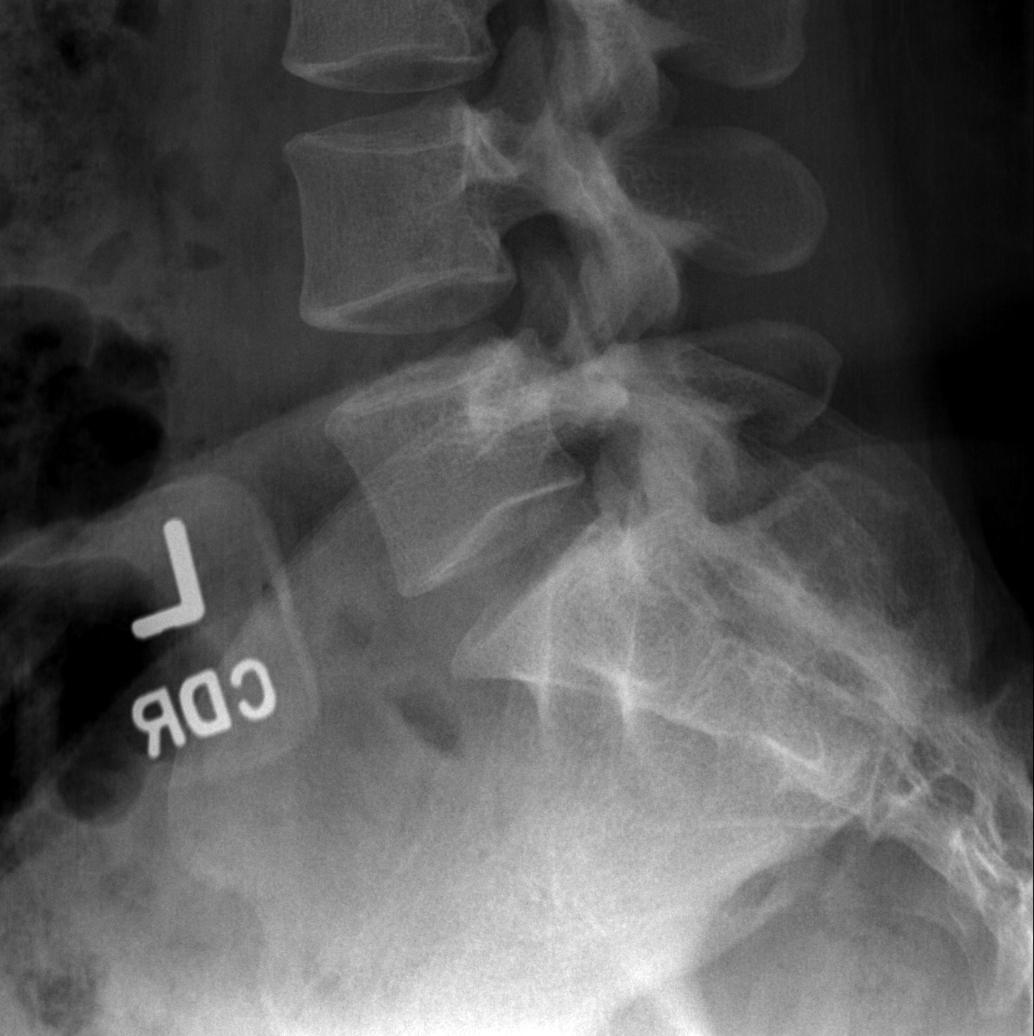

[5 of 5 positions shown; findings below may reference images not displayed]

FINDINGS: There is no evidence of lumbar spine fracture. Alignment is normal.
Intervertebral disc spaces are maintained.
IMPRESSION: No acute abnormality noted.

## 2023-03-09 DIAGNOSIS — Z419 Encounter for procedure for purposes other than remedying health state, unspecified: Secondary | ICD-10-CM | POA: Diagnosis not present

## 2023-04-06 DIAGNOSIS — Z3689 Encounter for other specified antenatal screening: Secondary | ICD-10-CM | POA: Diagnosis not present

## 2023-04-06 DIAGNOSIS — Z369 Encounter for antenatal screening, unspecified: Secondary | ICD-10-CM | POA: Diagnosis not present

## 2023-04-06 DIAGNOSIS — Z3A28 28 weeks gestation of pregnancy: Secondary | ICD-10-CM | POA: Diagnosis not present

## 2023-04-06 DIAGNOSIS — O99213 Obesity complicating pregnancy, third trimester: Secondary | ICD-10-CM | POA: Diagnosis not present

## 2023-04-09 DIAGNOSIS — Z419 Encounter for procedure for purposes other than remedying health state, unspecified: Secondary | ICD-10-CM | POA: Diagnosis not present

## 2023-04-13 DIAGNOSIS — R7302 Impaired glucose tolerance (oral): Secondary | ICD-10-CM | POA: Diagnosis not present

## 2023-04-16 DIAGNOSIS — O24919 Unspecified diabetes mellitus in pregnancy, unspecified trimester: Secondary | ICD-10-CM | POA: Diagnosis not present

## 2023-04-16 DIAGNOSIS — Z3A00 Weeks of gestation of pregnancy not specified: Secondary | ICD-10-CM | POA: Diagnosis not present

## 2023-04-20 DIAGNOSIS — O24419 Gestational diabetes mellitus in pregnancy, unspecified control: Secondary | ICD-10-CM | POA: Diagnosis not present

## 2023-05-06 DIAGNOSIS — O24410 Gestational diabetes mellitus in pregnancy, diet controlled: Secondary | ICD-10-CM | POA: Diagnosis not present

## 2023-05-06 DIAGNOSIS — Z3689 Encounter for other specified antenatal screening: Secondary | ICD-10-CM | POA: Diagnosis not present

## 2023-05-06 DIAGNOSIS — Z3A32 32 weeks gestation of pregnancy: Secondary | ICD-10-CM | POA: Diagnosis not present

## 2023-05-07 DIAGNOSIS — H5213 Myopia, bilateral: Secondary | ICD-10-CM | POA: Diagnosis not present

## 2023-05-10 DIAGNOSIS — Z419 Encounter for procedure for purposes other than remedying health state, unspecified: Secondary | ICD-10-CM | POA: Diagnosis not present

## 2023-05-17 DIAGNOSIS — O26893 Other specified pregnancy related conditions, third trimester: Secondary | ICD-10-CM | POA: Diagnosis not present

## 2023-05-17 DIAGNOSIS — R109 Unspecified abdominal pain: Secondary | ICD-10-CM | POA: Diagnosis not present

## 2023-05-17 DIAGNOSIS — O24410 Gestational diabetes mellitus in pregnancy, diet controlled: Secondary | ICD-10-CM | POA: Diagnosis not present

## 2023-05-17 DIAGNOSIS — Z3A33 33 weeks gestation of pregnancy: Secondary | ICD-10-CM | POA: Diagnosis not present

## 2023-05-21 DIAGNOSIS — Z3482 Encounter for supervision of other normal pregnancy, second trimester: Secondary | ICD-10-CM | POA: Diagnosis not present

## 2023-05-21 DIAGNOSIS — Z3483 Encounter for supervision of other normal pregnancy, third trimester: Secondary | ICD-10-CM | POA: Diagnosis not present

## 2023-05-25 ENCOUNTER — Telehealth: Payer: Self-pay

## 2023-05-25 DIAGNOSIS — O24415 Gestational diabetes mellitus in pregnancy, controlled by oral hypoglycemic drugs: Secondary | ICD-10-CM | POA: Diagnosis not present

## 2023-05-25 DIAGNOSIS — Z3A35 35 weeks gestation of pregnancy: Secondary | ICD-10-CM | POA: Diagnosis not present

## 2023-05-25 NOTE — Telephone Encounter (Signed)
LVM for patient to call back [phone], or to call PCP office to schedule follow up apt. AS, CMA  

## 2023-06-02 DIAGNOSIS — Z3A36 36 weeks gestation of pregnancy: Secondary | ICD-10-CM | POA: Diagnosis not present

## 2023-06-02 DIAGNOSIS — O24415 Gestational diabetes mellitus in pregnancy, controlled by oral hypoglycemic drugs: Secondary | ICD-10-CM | POA: Diagnosis not present

## 2023-06-02 DIAGNOSIS — Z3493 Encounter for supervision of normal pregnancy, unspecified, third trimester: Secondary | ICD-10-CM | POA: Diagnosis not present

## 2023-06-08 DIAGNOSIS — Z3A37 37 weeks gestation of pregnancy: Secondary | ICD-10-CM | POA: Diagnosis not present

## 2023-06-08 DIAGNOSIS — O24415 Gestational diabetes mellitus in pregnancy, controlled by oral hypoglycemic drugs: Secondary | ICD-10-CM | POA: Diagnosis not present

## 2023-06-08 DIAGNOSIS — O09893 Supervision of other high risk pregnancies, third trimester: Secondary | ICD-10-CM | POA: Diagnosis not present

## 2023-06-08 DIAGNOSIS — Z8619 Personal history of other infectious and parasitic diseases: Secondary | ICD-10-CM | POA: Diagnosis not present

## 2023-06-09 DIAGNOSIS — Z419 Encounter for procedure for purposes other than remedying health state, unspecified: Secondary | ICD-10-CM | POA: Diagnosis not present

## 2023-06-16 DIAGNOSIS — O288 Other abnormal findings on antenatal screening of mother: Secondary | ICD-10-CM | POA: Diagnosis not present

## 2023-06-16 DIAGNOSIS — Z3A00 Weeks of gestation of pregnancy not specified: Secondary | ICD-10-CM | POA: Diagnosis not present

## 2023-06-16 DIAGNOSIS — O24415 Gestational diabetes mellitus in pregnancy, controlled by oral hypoglycemic drugs: Secondary | ICD-10-CM | POA: Diagnosis not present

## 2023-06-16 DIAGNOSIS — Z3A38 38 weeks gestation of pregnancy: Secondary | ICD-10-CM | POA: Diagnosis not present

## 2023-06-19 DIAGNOSIS — Z3689 Encounter for other specified antenatal screening: Secondary | ICD-10-CM | POA: Diagnosis not present

## 2023-06-22 DIAGNOSIS — O9902 Anemia complicating childbirth: Secondary | ICD-10-CM | POA: Diagnosis not present

## 2023-06-22 DIAGNOSIS — Z888 Allergy status to other drugs, medicaments and biological substances status: Secondary | ICD-10-CM | POA: Diagnosis not present

## 2023-06-22 DIAGNOSIS — O24425 Gestational diabetes mellitus in childbirth, controlled by oral hypoglycemic drugs: Secondary | ICD-10-CM | POA: Diagnosis not present

## 2023-06-22 DIAGNOSIS — Z3A40 40 weeks gestation of pregnancy: Secondary | ICD-10-CM | POA: Diagnosis not present

## 2023-06-22 DIAGNOSIS — Z79899 Other long term (current) drug therapy: Secondary | ICD-10-CM | POA: Diagnosis not present

## 2023-06-22 DIAGNOSIS — Z3A39 39 weeks gestation of pregnancy: Secondary | ICD-10-CM | POA: Diagnosis not present

## 2023-06-22 DIAGNOSIS — D649 Anemia, unspecified: Secondary | ICD-10-CM | POA: Diagnosis not present

## 2023-07-10 DIAGNOSIS — Z419 Encounter for procedure for purposes other than remedying health state, unspecified: Secondary | ICD-10-CM | POA: Diagnosis not present

## 2023-07-20 DIAGNOSIS — Z1332 Encounter for screening for maternal depression: Secondary | ICD-10-CM | POA: Diagnosis not present

## 2023-07-20 DIAGNOSIS — Z131 Encounter for screening for diabetes mellitus: Secondary | ICD-10-CM | POA: Diagnosis not present

## 2023-08-09 DIAGNOSIS — Z419 Encounter for procedure for purposes other than remedying health state, unspecified: Secondary | ICD-10-CM | POA: Diagnosis not present

## 2023-09-09 DIAGNOSIS — Z419 Encounter for procedure for purposes other than remedying health state, unspecified: Secondary | ICD-10-CM | POA: Diagnosis not present

## 2023-09-10 DIAGNOSIS — R197 Diarrhea, unspecified: Secondary | ICD-10-CM | POA: Diagnosis not present

## 2023-09-10 DIAGNOSIS — Z8619 Personal history of other infectious and parasitic diseases: Secondary | ICD-10-CM | POA: Diagnosis not present

## 2023-09-10 DIAGNOSIS — R1110 Vomiting, unspecified: Secondary | ICD-10-CM | POA: Diagnosis not present

## 2023-09-10 DIAGNOSIS — K589 Irritable bowel syndrome without diarrhea: Secondary | ICD-10-CM | POA: Diagnosis not present

## 2023-09-10 DIAGNOSIS — R109 Unspecified abdominal pain: Secondary | ICD-10-CM | POA: Diagnosis not present

## 2023-09-11 DIAGNOSIS — R1110 Vomiting, unspecified: Secondary | ICD-10-CM | POA: Diagnosis not present

## 2023-09-11 DIAGNOSIS — R109 Unspecified abdominal pain: Secondary | ICD-10-CM | POA: Diagnosis not present

## 2023-09-11 DIAGNOSIS — R197 Diarrhea, unspecified: Secondary | ICD-10-CM | POA: Diagnosis not present

## 2023-09-18 DIAGNOSIS — Z3009 Encounter for other general counseling and advice on contraception: Secondary | ICD-10-CM | POA: Diagnosis not present

## 2023-09-18 DIAGNOSIS — N912 Amenorrhea, unspecified: Secondary | ICD-10-CM | POA: Diagnosis not present

## 2023-10-10 DIAGNOSIS — Z419 Encounter for procedure for purposes other than remedying health state, unspecified: Secondary | ICD-10-CM | POA: Diagnosis not present

## 2023-11-07 DIAGNOSIS — Z419 Encounter for procedure for purposes other than remedying health state, unspecified: Secondary | ICD-10-CM | POA: Diagnosis not present

## 2023-11-18 DIAGNOSIS — Z419 Encounter for procedure for purposes other than remedying health state, unspecified: Secondary | ICD-10-CM | POA: Diagnosis not present

## 2023-12-19 DIAGNOSIS — Z419 Encounter for procedure for purposes other than remedying health state, unspecified: Secondary | ICD-10-CM | POA: Diagnosis not present

## 2024-01-18 DIAGNOSIS — Z419 Encounter for procedure for purposes other than remedying health state, unspecified: Secondary | ICD-10-CM | POA: Diagnosis not present

## 2024-02-18 DIAGNOSIS — Z419 Encounter for procedure for purposes other than remedying health state, unspecified: Secondary | ICD-10-CM | POA: Diagnosis not present

## 2024-03-19 DIAGNOSIS — Z419 Encounter for procedure for purposes other than remedying health state, unspecified: Secondary | ICD-10-CM | POA: Diagnosis not present

## 2024-03-25 DIAGNOSIS — M25461 Effusion, right knee: Secondary | ICD-10-CM | POA: Diagnosis not present

## 2024-03-25 DIAGNOSIS — E66812 Obesity, class 2: Secondary | ICD-10-CM | POA: Diagnosis not present

## 2024-03-25 DIAGNOSIS — Z1331 Encounter for screening for depression: Secondary | ICD-10-CM | POA: Diagnosis not present

## 2024-03-25 DIAGNOSIS — Z6839 Body mass index (BMI) 39.0-39.9, adult: Secondary | ICD-10-CM | POA: Diagnosis not present

## 2024-03-25 DIAGNOSIS — M25561 Pain in right knee: Secondary | ICD-10-CM | POA: Diagnosis not present

## 2024-03-25 DIAGNOSIS — F411 Generalized anxiety disorder: Secondary | ICD-10-CM | POA: Diagnosis not present

## 2024-03-31 DIAGNOSIS — R300 Dysuria: Secondary | ICD-10-CM | POA: Diagnosis not present

## 2024-03-31 DIAGNOSIS — Z113 Encounter for screening for infections with a predominantly sexual mode of transmission: Secondary | ICD-10-CM | POA: Diagnosis not present

## 2024-03-31 DIAGNOSIS — B3731 Acute candidiasis of vulva and vagina: Secondary | ICD-10-CM | POA: Diagnosis not present

## 2024-04-19 DIAGNOSIS — Z419 Encounter for procedure for purposes other than remedying health state, unspecified: Secondary | ICD-10-CM | POA: Diagnosis not present

## 2024-05-20 DIAGNOSIS — Z419 Encounter for procedure for purposes other than remedying health state, unspecified: Secondary | ICD-10-CM | POA: Diagnosis not present

## 2024-06-14 DIAGNOSIS — N920 Excessive and frequent menstruation with regular cycle: Secondary | ICD-10-CM | POA: Diagnosis not present

## 2024-07-28 DIAGNOSIS — N920 Excessive and frequent menstruation with regular cycle: Secondary | ICD-10-CM | POA: Diagnosis not present

## 2024-08-23 DIAGNOSIS — Z302 Encounter for sterilization: Secondary | ICD-10-CM | POA: Diagnosis not present

## 2024-08-23 DIAGNOSIS — N939 Abnormal uterine and vaginal bleeding, unspecified: Secondary | ICD-10-CM | POA: Diagnosis not present
